# Patient Record
Sex: Female | Born: 1950 | Race: White | Hispanic: No | Marital: Married | Smoking: Never smoker
Health system: Southern US, Community
[De-identification: ages and names within clinical notes are randomized; demographics above are authoritative.]

## PROBLEM LIST (undated history)

## (undated) DIAGNOSIS — I10 Essential (primary) hypertension: Secondary | ICD-10-CM

## (undated) DIAGNOSIS — R251 Tremor, unspecified: Secondary | ICD-10-CM

## (undated) DIAGNOSIS — F99 Mental disorder, not otherwise specified: Secondary | ICD-10-CM

## (undated) DIAGNOSIS — F329 Major depressive disorder, single episode, unspecified: Secondary | ICD-10-CM

## (undated) DIAGNOSIS — E119 Type 2 diabetes mellitus without complications: Secondary | ICD-10-CM

## (undated) DIAGNOSIS — F32A Depression, unspecified: Secondary | ICD-10-CM

## (undated) HISTORY — PX: NASAL SINUS SURGERY: SHX719

## (undated) HISTORY — DX: Major depressive disorder, single episode, unspecified: F32.9

## (undated) HISTORY — DX: Depression, unspecified: F32.A

## (undated) HISTORY — DX: Type 2 diabetes mellitus without complications: E11.9

## (undated) HISTORY — DX: Essential (primary) hypertension: I10

## (undated) HISTORY — PX: GALLBLADDER SURGERY: SHX652

## (undated) HISTORY — PX: YAG LASER APPLICATION: SHX6189

## (undated) HISTORY — PX: FOOT SURGERY: SHX648

## (undated) HISTORY — DX: Tremor, unspecified: R25.1

## (undated) HISTORY — DX: Mental disorder, not otherwise specified: F99

## (undated) HISTORY — PX: CATARACT EXTRACTION: SUR2

## (undated) HISTORY — PX: OTHER SURGICAL HISTORY: SHX169

---

## 1997-09-06 ENCOUNTER — Other Ambulatory Visit: Admission: RE | Admit: 1997-09-06 | Discharge: 1997-09-06 | Payer: Self-pay | Admitting: Internal Medicine

## 1999-02-20 ENCOUNTER — Other Ambulatory Visit: Admission: RE | Admit: 1999-02-20 | Discharge: 1999-02-20 | Payer: Self-pay | Admitting: Internal Medicine

## 1999-10-05 ENCOUNTER — Ambulatory Visit (HOSPITAL_COMMUNITY): Admission: RE | Admit: 1999-10-05 | Discharge: 1999-10-05 | Payer: Self-pay | Admitting: Neurology

## 1999-10-05 ENCOUNTER — Encounter: Payer: Self-pay | Admitting: Neurology

## 2001-03-22 ENCOUNTER — Ambulatory Visit (HOSPITAL_COMMUNITY): Admission: RE | Admit: 2001-03-22 | Discharge: 2001-03-22 | Payer: Self-pay | Admitting: Internal Medicine

## 2001-03-22 ENCOUNTER — Encounter: Payer: Self-pay | Admitting: Internal Medicine

## 2001-10-04 ENCOUNTER — Ambulatory Visit (HOSPITAL_COMMUNITY): Admission: RE | Admit: 2001-10-04 | Discharge: 2001-10-04 | Payer: Self-pay | Admitting: Pulmonary Disease

## 2001-10-04 ENCOUNTER — Encounter: Payer: Self-pay | Admitting: Pulmonary Disease

## 2002-08-24 ENCOUNTER — Encounter: Payer: Self-pay | Admitting: Pulmonary Disease

## 2002-08-24 ENCOUNTER — Ambulatory Visit (HOSPITAL_COMMUNITY): Admission: RE | Admit: 2002-08-24 | Discharge: 2002-08-24 | Payer: Self-pay | Admitting: Pulmonary Disease

## 2003-02-15 ENCOUNTER — Encounter: Admission: RE | Admit: 2003-02-15 | Discharge: 2003-02-15 | Payer: Self-pay | Admitting: Otolaryngology

## 2003-02-15 ENCOUNTER — Encounter: Payer: Self-pay | Admitting: Otolaryngology

## 2003-02-21 ENCOUNTER — Encounter (INDEPENDENT_AMBULATORY_CARE_PROVIDER_SITE_OTHER): Payer: Self-pay | Admitting: *Deleted

## 2003-02-21 ENCOUNTER — Ambulatory Visit (HOSPITAL_COMMUNITY): Admission: RE | Admit: 2003-02-21 | Discharge: 2003-02-21 | Payer: Self-pay | Admitting: Otolaryngology

## 2003-08-07 ENCOUNTER — Encounter: Admission: RE | Admit: 2003-08-07 | Discharge: 2003-08-07 | Payer: Self-pay | Admitting: Neurology

## 2003-09-05 ENCOUNTER — Encounter: Admission: RE | Admit: 2003-09-05 | Discharge: 2003-09-05 | Payer: Self-pay | Admitting: Neurology

## 2003-10-01 ENCOUNTER — Encounter: Admission: RE | Admit: 2003-10-01 | Discharge: 2003-10-01 | Payer: Self-pay | Admitting: Otolaryngology

## 2006-01-14 ENCOUNTER — Inpatient Hospital Stay (HOSPITAL_COMMUNITY): Admission: EM | Admit: 2006-01-14 | Discharge: 2006-01-15 | Payer: Self-pay | Admitting: Emergency Medicine

## 2006-02-01 IMAGING — CT CT MAXILLOFACIAL W/ CM
3 of 4 series · 17 of 30 positions shown, 19 images · IV contrast (omnipaque)
Comparison: none

CLINICAL DATA: Follow-up sclerotic sphenoid lesion. The patient reports previous biopsy in January 2003.  
 CT MAXILLOFACIAL WITH CONTRAST
 Axial and direct coronal imaging of the paranasal sinuses was performed following the IV bolus injection of 75 cc Omnipaque 300.

[Series 4: coronal · axial · 0.33mm/px · z∈[-60,+27]mm · 6 of 51 slices shown, 8 images]
[im 8/51  brain]
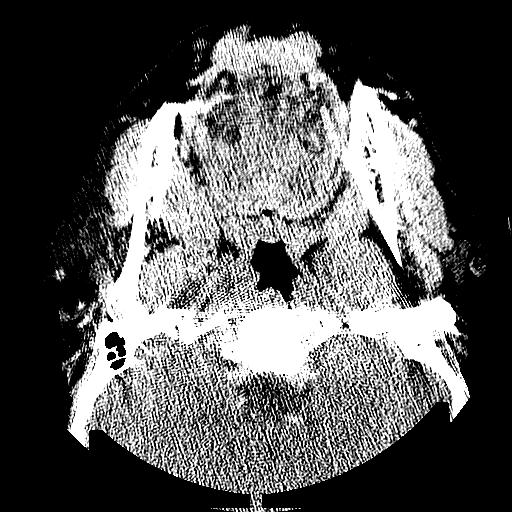
[im 8/51  bone]
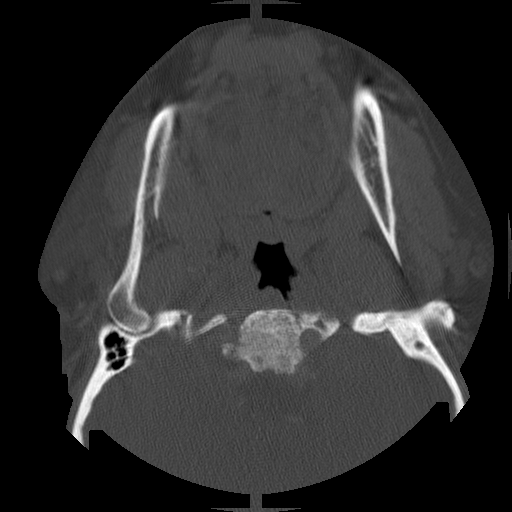
[im 15/51  bone]
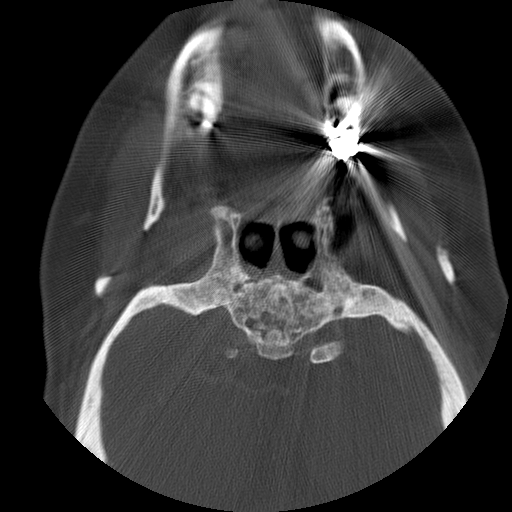
[im 22/51  bone]
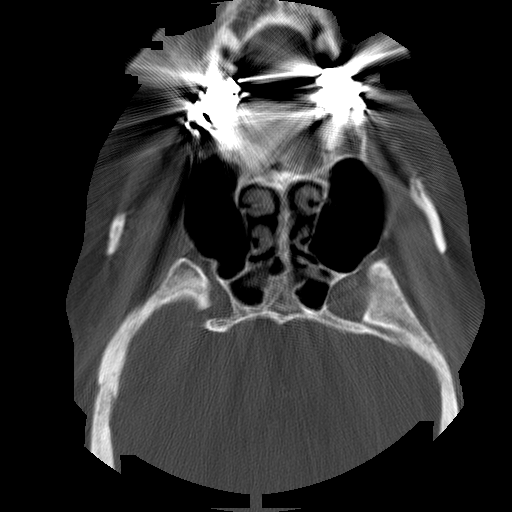
[im 29/51  bone]
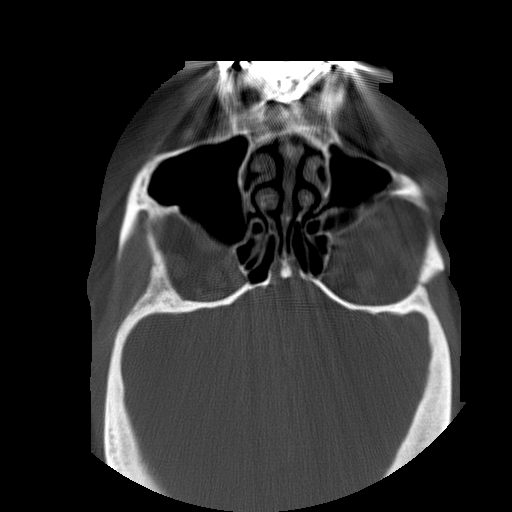
[im 36/51  brain]
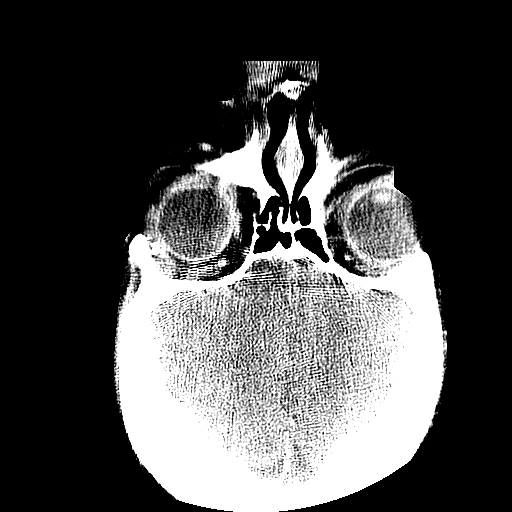
[im 36/51  bone]
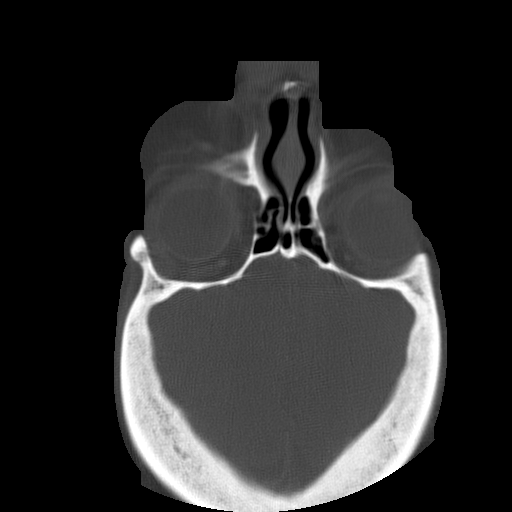
[im 43/51  bone]
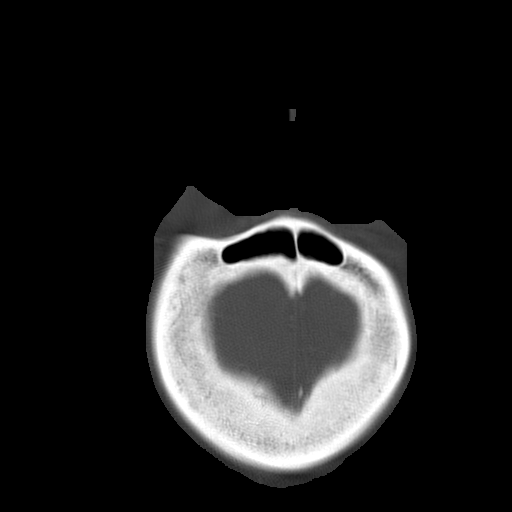

[Series 5: recon 2: coronal · axial · 0.33mm/px · z∈[-60,+27]mm · 6 of 51 slices shown]
[im 8/51  bone]
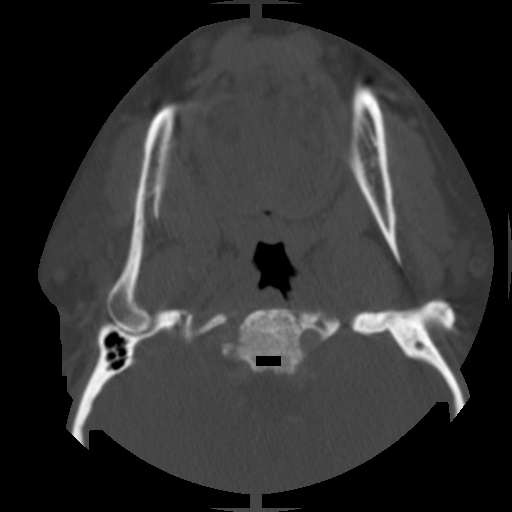
[im 15/51  bone]
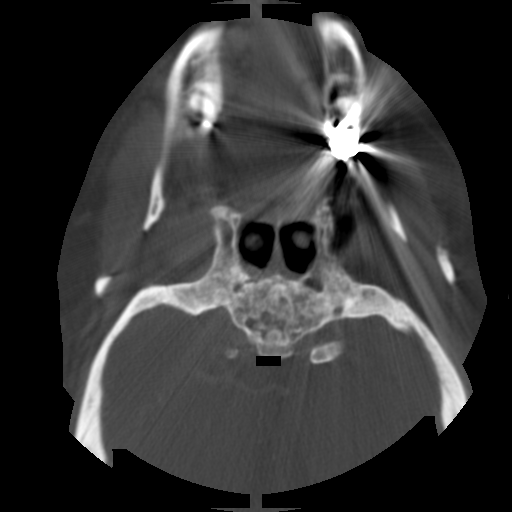
[im 22/51  bone]
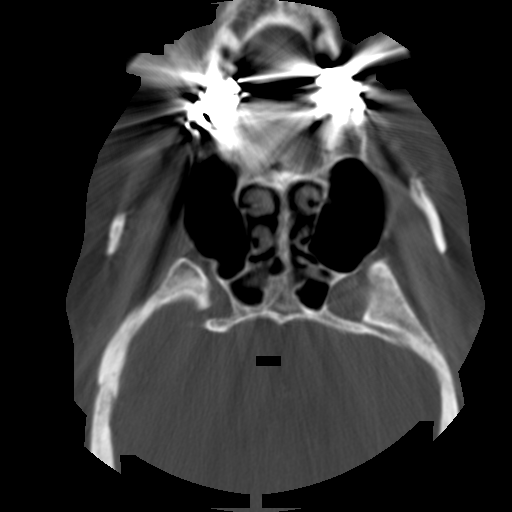
[im 29/51  bone]
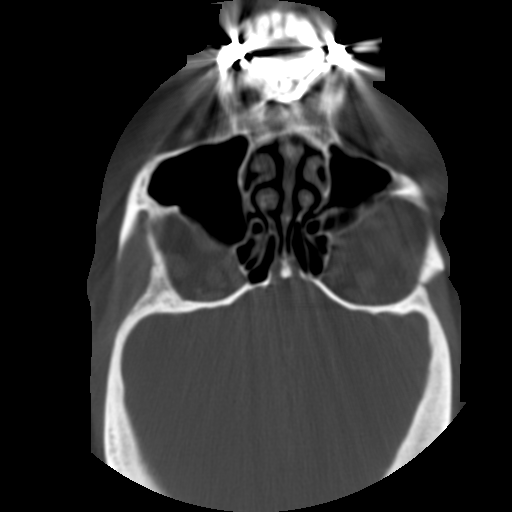
[im 36/51  bone]
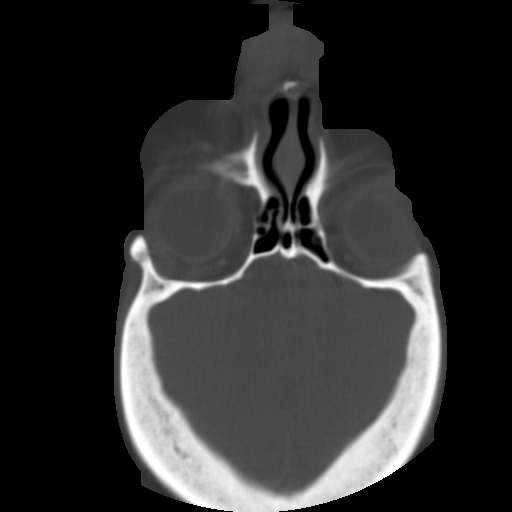
[im 43/51  bone]
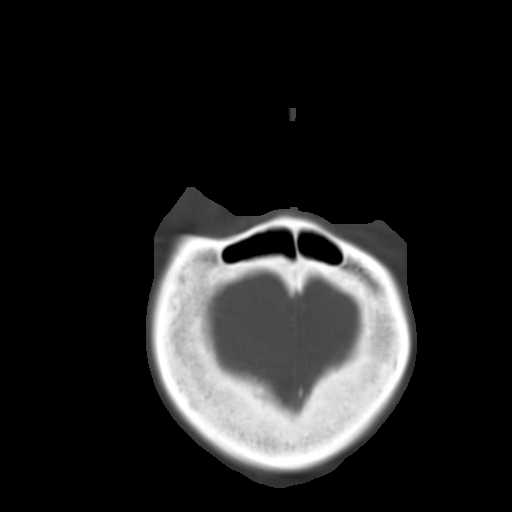

[Series 7: supine sinuses · axial · 0.33mm/px · z∈[-47,+31]mm · 5 of 47 slices shown]
[im 8/47  bone]
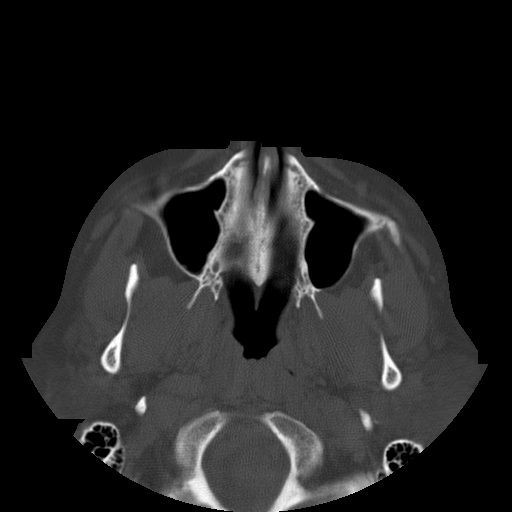
[im 16/47  bone]
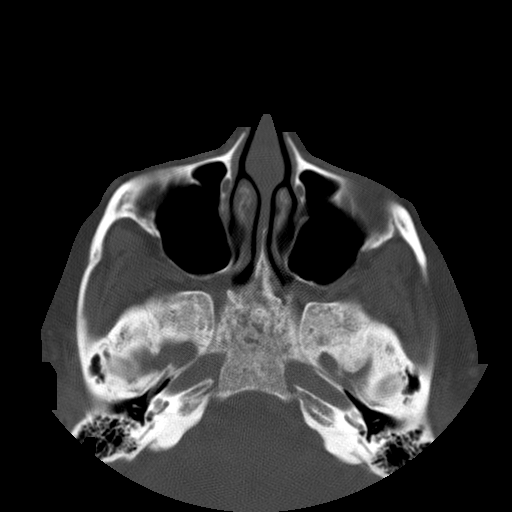
[im 24/47  bone]
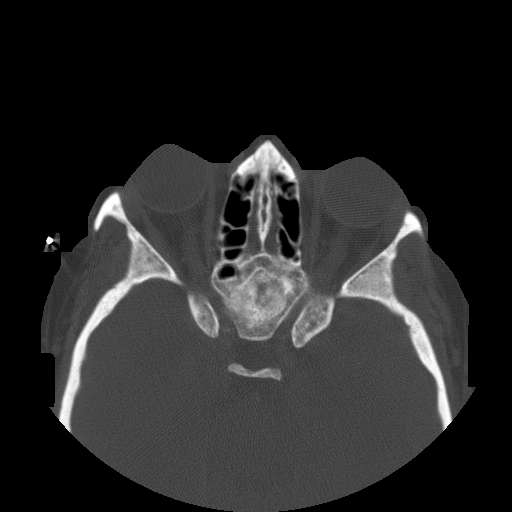
[im 31/47  bone]
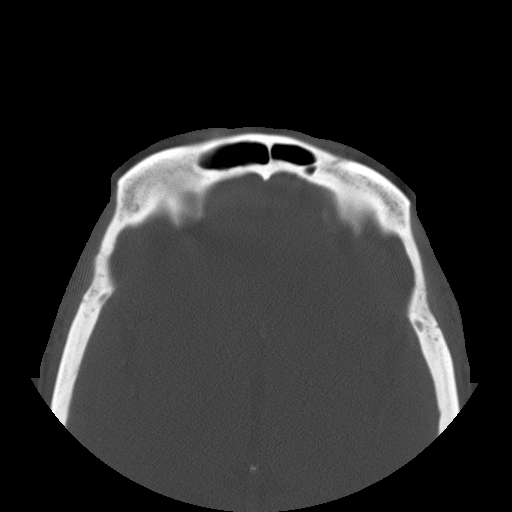
[im 39/47  bone]
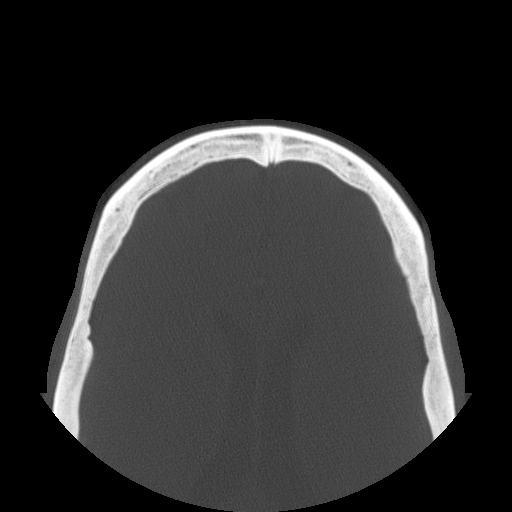

[17 of 30 positions shown; findings below may reference images not displayed]

Correlation is made with the prior study done 02/15/03 at [REDACTED]. 

 No change is demonstrated in the sclerotic slightly expansile lesion of the skull base, predominantly involving the clivus, anterior clinoid and bilateral pterygoid processes.  There is no associated soft tissue mass.  Slight narrowing of the right Vidian canal is again demonstrated, without apparent change.  No compromise of the orbital apex is demonstrated.  Additional skull base foramina appear unremarkable.

 The paranasal sinuses remain clear without air fluid levels or mucosal thickening.  Chronic deformity of the sphenoid sinus secondary to the bony expansion of the clivus appears stable.  The ostiomeatal complexes appear bilaterally.  No orbital pathology or intracranial abnormality is demonstrated.
 IMPRESSION
 1.  Stable sclerotic lesion of the skull base, most compatible with fibrous dysplasia.  There is stable mild narrowing of the right Vidian canal with respect to the left. 
 2.  No evidence of associated soft tissue mass or acute sinusitis.

## 2008-09-04 ENCOUNTER — Encounter: Admission: RE | Admit: 2008-09-04 | Discharge: 2008-09-04 | Payer: Self-pay | Admitting: Neurology

## 2008-09-19 ENCOUNTER — Encounter: Admission: RE | Admit: 2008-09-19 | Discharge: 2008-09-19 | Payer: Self-pay | Admitting: Neurology

## 2010-05-19 ENCOUNTER — Encounter: Payer: Self-pay | Admitting: Neurology

## 2010-09-12 NOTE — Cardiovascular Report (Signed)
[name], Lane NO.:  [id number]   MEDICAL RECORD NO.:  [id number]          PATIENT TYPE:  INP   LOCATION:  2807                         FACILITY:  [facility]   PHYSICIAN:  Darlin Priestly, MD  DATE OF BIRTH:  02-24-1951   DATE OF PROCEDURE:  DATE OF DISCHARGE:                              CARDIAC CATHETERIZATION   PROCEDURE:  1. Left heart catheterization.  2. Coronary angiography.  3. Left ventriculogram.   ATTENDING:  Dr. Lenise Herald.   COMPLICATIONS:  None.   INDICATIONS:  [name] is a 60 year old female with a history of diabetes  who was seen in the office today, on January 14, 2006, with a complaint of  2 weeks of substernal chest pain radiating to her back and left arm,  associated with shortness of breath and nausea.  She has had remote cardiac  catheterization done in [facility] which she believes was normal.  She is  now referred for re-catheterization to rule out significant CAD.   DESCRIPTION OF OPERATION:  After getting informed consent, the patient was  brought to the cardiac cath lab.  The right groin was shaved, prepped and  draped in the usual sterile fashion.  __________ was established.  Using  Modified Seldinger technique, a #6-French arterial sheath was inserted in  the right femoral artery.  A 6-French diagnostic catheter was then  used to  perform diagnostic angiography.   Left main is a large vessel with no significant disease.   The LAD is a large vessel which courses __________ two diagonal branches.  The LAD has mild 23rd % ostial narrowing.  The remainder of the LAD has no  significant disease.   First and second diagonal show small- to medium-sized vessels with no  significant disease.   Left circumflex is a large vessel which courses to the AV grove to two  obtuse marginal branches.  The AV groove circumflex has no significant  disease.   The first OM is a large vessel which bifurcates into the mid segment  with no  significant disease.   The second OM is a small vessel with no significant disease.   The right coronary artery is a large vessel that is dominant.  It gives rise  to a PDA as well as a posterolateral branch.  There is no significant  disease in the RCA, PDA or posterolateral branch.   Left ventriculogram reveals a preserved EF of 60%.   HEMODYNAMICS:  Systemic arterial pressure 130/73, LV systolic pressure  125/4, LVEDP of 9.   CONCLUSION:  1. No significant coronary artery disease.  2. Normal LV systolic function.      Darlin Priestly, MD  Electronically Signed     RHM/MEDQ  D:  01/14/2006  T:  01/16/2006  Job:  [id number]

## 2010-09-12 NOTE — Op Note (Signed)
Patricia Lane, Patricia Lane                            ACCOUNT NO.:  [id number]   MEDICAL RECORD NO.:  [id number]                   PATIENT TYPE:  OIB   LOCATION:  2891                                 FACILITY:  [facility]   PHYSICIAN:  Jefry H. Pollyann Kennedy, M.D.                DATE OF BIRTH:  1950/05/15   DATE OF PROCEDURE:  02/21/2003  DATE OF DISCHARGE:                                 OPERATIVE REPORT   PREOPERATIVE DIAGNOSIS:  Sphenoid/clivus mass.   POSTOPERATIVE DIAGNOSIS:  Sphenoid/clivus mass.   OPERATION PERFORMED:  Right endoscopic sphenoidotomy with biopsy of sphenoid  mass.   SURGEON:  Jefry H. Pollyann Kennedy, M.D.   ANESTHESIA:  General endotracheal.   COMPLICATIONS:  None.   ESTIMATED BLOOD LOSS:  60mL.   FINDINGS:  Severe thickening and hardening of the bone of the face of the  sphenoid with an irregularly surfaced bony contents within the sphenoid,  minimal airspace present.  Biopsies were sent for pathologic evaluation.   REFERRING PHYSICIAN:  1. C. Lesia Sago, M.D.  2. Coletta Memos, M.D.   INDICATIONS FOR PROCEDURE:  The patient is a 60 year old lady who recently  had a syncopal episode and was being worked up.  Had an MRI scan of the  brain which revealed a mass involving the sphenoid and clivus area.  CT  scans were performed as well.  This had the appearance consistent with  fibrous dysplasia although this was not a definitive diagnosis.  The risks,  benefits, alternatives and complications of the procedure were explained to  the patient, who  seemed to understand and agreed to surgery.   DESCRIPTION OF PROCEDURE:  The patient was taken to the operating room and  placed on the operating table in the supine position.  Following induction  of general endotracheal anesthesia, the patient was prepped and draped in  standard fashion.  Oxymetazoline spray was used preoperatively in the nasal  cavities.  1% Xylocaine with epinephrine was infiltrated into the face of  the  sphenoid, posterior septum bilaterally.  A spinal needle was used to  perform this.  A 0 degree nasal endoscope was used through the right nasal  cavity to view the posterior nasal cavity just adjacent to the superior  turbinate.  The posterior septum/sphenoid space was identified.  Some mucosa  was bitten away from this area using through-cut forceps.  There was no  identifiable ostium to the sinus.  A 4 mm osteotome was used to remove the  face of the sphenoid and posterior septum.  This came out in one nice piece.  The underlying bone had an irregular surface to it.  There was no mucosal  ulceration.  There was no evidence of infection or polypoid disease.  The  osteotome was used to shave off multiple fragments from the mass as well as  a small curet.  These were sent for pathologic evaluation.  The endoscopy  was performed on the left side as well and  an attempt was also made to open the sphenoid on the left in hopes that this  may be easier than the right side, but it was not.  The nasal cavities  posteriorly were packed with pledgets soaked in Afrin.  The patient was then  awakened, extubated and transferred to recovery in stable condition where  the pledgets were then removed.                                                Jefry H. Pollyann Kennedy, M.D.    JHR/MEDQ  D:  02/21/2003  T:  02/21/2003  Job:  [id number]   cc:   Marlan Palau, M.D.  [address]  [address]  [place]  [place] [address]  Fax: [phone]   Coletta Memos, M.D.  [address]  [place]  [place] [address]  Fax: [phone]

## 2010-09-12 NOTE — Discharge Summary (Signed)
NAMEBERETTA, GINSBERG NO.:  [id number]   MEDICAL RECORD NO.:  [id number]          PATIENT TYPE:  INP   LOCATION:  4729                         FACILITY:  [facility]   PHYSICIAN:  Lezlie Octave, N.P.     DATE OF BIRTH:  09/25/1950   DATE OF ADMISSION:  01/14/2006  DATE OF DISCHARGE:  01/15/2006                                 DISCHARGE SUMMARY   HISTORY OF PRESENT ILLNESS:  Ms. Seese is a 60 year old white female who was  seen in the office for the first time on January 14, 2006.  She was  complaining about a 2-week history of chest pain, left arm pain, shortness  of breath, and nausea.  She was seen by Dr. Nanetta Batty.  It was decided  to admit her.  Started her on IV nitroglycerin and heparin, and she should  undergo cardiac catheterization.  She apparently had multiple risk factors  including diabetes, hypertension, hyperlipidemia, and I believe, premature  family history of heart disease.  She underwent cardiac catheterization on  January 14, 2006.  It showed normal coronary arteries except for 30% LAD  proximal stenosis.  Her EF was 60%.  The morning of January 15, 2006, she  said she had had some slight chest discomfort intermittently but it was  mild.  She was placed on Protonix for PPI prophylaxis, in case her chest  pain was related to GI disturbance.  She then had a run of Y-complex  tachycardia, 13 beats which was irregular.  It was decided it was atrial fib  with a variance, was reviewed by Dr. Jacinto Halim.  He talked to the patient about  atrial fib and Coumadin therapy.  It is decided that since she was  asymptomatic, she should wear a 3 week cardia net monitor to evaluate the  frequency of her A-fib.  If frequent, she will need Coumadin therapy and  consideration for A-fib ablation.  He also explained to her that she should  lose weight and the importance of her weight loss in preventing atrial fib,  etc.   LABORATORY DATA:  Hemoglobin 11.9, hematocrit  33.7, platelets 280, WBC 5.5,  sodium 140, potassium 3.7, BUN 5, creatinine 0.9, glucose was 159.   DISCHARGE MEDICATIONS:  1. Glucophage 1000 mg 2 times a day.  She should start that on Sunday.  2. Avalide 150/12.5 every day.  3. Neurontin 400 mg 2 times per day.  4. Actos 30 mg 1 time per day.  5. Lantus insulin 30 units at bedtime.  6. Aspirin 325 mg every day.  7. Protonix 40 mg every day.  8. Toprol-XL 25 mg daily.  9. Calcium as taken previously.   FOLLOWUP:  She will followup with myself for a groin check on January 19, 2006, at 10:00 a.m.  The cardia net company will call her about instructions  on placing her monitor.  And she will followup with Dr. Nanetta Batty on  February 12, 2006, at 9:00 a.m.   DISCHARGE DIAGNOSIS:  1. Chest pain, not coronary ischemia, status post cardiac catheterization  with only 30% LAD proximal.  Chest pain probably related to      gastroesophageal reflux disease versus musculoskeletal origin.  2. Insulin dependent diabetes mellitus, uncontrolled.  3. Hypertension.  4. Questionable gastroesophageal reflux disease.  5. Questionable musculoskeletal chest pain.  6. Paroxysmal atrial fibrillation with rapid ventricular response.  She      had 13 beats with Y-complex tachycardia.  It was very irregular.      Reviewed by Dr. Jacinto Halim.  Considered to be atrial fibrillation with a      variance.      Lezlie Octave, N.P.     BB/MEDQ  D:  01/15/2006  T:  01/16/2006  Job:  [id number]   cc:   MD Ranae Pila

## 2011-12-14 DIAGNOSIS — L909 Atrophic disorder of skin, unspecified: Secondary | ICD-10-CM | POA: Diagnosis not present

## 2011-12-14 DIAGNOSIS — L919 Hypertrophic disorder of the skin, unspecified: Secondary | ICD-10-CM | POA: Diagnosis not present

## 2011-12-14 DIAGNOSIS — R945 Abnormal results of liver function studies: Secondary | ICD-10-CM | POA: Diagnosis not present

## 2011-12-14 DIAGNOSIS — I10 Essential (primary) hypertension: Secondary | ICD-10-CM | POA: Diagnosis not present

## 2011-12-14 DIAGNOSIS — E1149 Type 2 diabetes mellitus with other diabetic neurological complication: Secondary | ICD-10-CM | POA: Diagnosis not present

## 2011-12-14 DIAGNOSIS — Z79899 Other long term (current) drug therapy: Secondary | ICD-10-CM | POA: Diagnosis not present

## 2011-12-14 DIAGNOSIS — E7800 Pure hypercholesterolemia, unspecified: Secondary | ICD-10-CM | POA: Diagnosis not present

## 2011-12-17 DIAGNOSIS — Z79899 Other long term (current) drug therapy: Secondary | ICD-10-CM | POA: Diagnosis not present

## 2011-12-17 DIAGNOSIS — E7800 Pure hypercholesterolemia, unspecified: Secondary | ICD-10-CM | POA: Diagnosis not present

## 2011-12-17 DIAGNOSIS — R945 Abnormal results of liver function studies: Secondary | ICD-10-CM | POA: Diagnosis not present

## 2011-12-17 DIAGNOSIS — E1149 Type 2 diabetes mellitus with other diabetic neurological complication: Secondary | ICD-10-CM | POA: Diagnosis not present

## 2011-12-23 DIAGNOSIS — L909 Atrophic disorder of skin, unspecified: Secondary | ICD-10-CM | POA: Diagnosis not present

## 2012-01-15 DIAGNOSIS — H11159 Pinguecula, unspecified eye: Secondary | ICD-10-CM | POA: Diagnosis not present

## 2012-01-15 DIAGNOSIS — E119 Type 2 diabetes mellitus without complications: Secondary | ICD-10-CM | POA: Diagnosis not present

## 2012-02-17 DIAGNOSIS — N952 Postmenopausal atrophic vaginitis: Secondary | ICD-10-CM | POA: Diagnosis not present

## 2012-02-17 DIAGNOSIS — Z1289 Encounter for screening for malignant neoplasm of other sites: Secondary | ICD-10-CM | POA: Diagnosis not present

## 2012-02-17 DIAGNOSIS — I10 Essential (primary) hypertension: Secondary | ICD-10-CM | POA: Diagnosis not present

## 2012-02-17 DIAGNOSIS — E119 Type 2 diabetes mellitus without complications: Secondary | ICD-10-CM | POA: Diagnosis not present

## 2012-02-17 DIAGNOSIS — Z23 Encounter for immunization: Secondary | ICD-10-CM | POA: Diagnosis not present

## 2012-02-17 DIAGNOSIS — Z79899 Other long term (current) drug therapy: Secondary | ICD-10-CM | POA: Diagnosis not present

## 2012-02-17 DIAGNOSIS — Z0000 Encounter for general adult medical examination without abnormal findings: Secondary | ICD-10-CM | POA: Diagnosis not present

## 2012-02-17 DIAGNOSIS — N959 Unspecified menopausal and perimenopausal disorder: Secondary | ICD-10-CM | POA: Diagnosis not present

## 2012-02-17 DIAGNOSIS — E1149 Type 2 diabetes mellitus with other diabetic neurological complication: Secondary | ICD-10-CM | POA: Diagnosis not present

## 2012-02-26 DIAGNOSIS — Z1231 Encounter for screening mammogram for malignant neoplasm of breast: Secondary | ICD-10-CM | POA: Diagnosis not present

## 2012-02-26 DIAGNOSIS — Z79899 Other long term (current) drug therapy: Secondary | ICD-10-CM | POA: Diagnosis not present

## 2012-02-26 DIAGNOSIS — N959 Unspecified menopausal and perimenopausal disorder: Secondary | ICD-10-CM | POA: Diagnosis not present

## 2012-04-16 DIAGNOSIS — K591 Functional diarrhea: Secondary | ICD-10-CM | POA: Diagnosis not present

## 2012-06-07 DIAGNOSIS — I10 Essential (primary) hypertension: Secondary | ICD-10-CM | POA: Diagnosis not present

## 2012-06-07 DIAGNOSIS — R945 Abnormal results of liver function studies: Secondary | ICD-10-CM | POA: Diagnosis not present

## 2012-06-07 DIAGNOSIS — E1149 Type 2 diabetes mellitus with other diabetic neurological complication: Secondary | ICD-10-CM | POA: Diagnosis not present

## 2012-06-07 DIAGNOSIS — E7800 Pure hypercholesterolemia, unspecified: Secondary | ICD-10-CM | POA: Diagnosis not present

## 2012-07-11 DIAGNOSIS — J069 Acute upper respiratory infection, unspecified: Secondary | ICD-10-CM | POA: Diagnosis not present

## 2012-07-19 DIAGNOSIS — J069 Acute upper respiratory infection, unspecified: Secondary | ICD-10-CM | POA: Diagnosis not present

## 2012-09-05 DIAGNOSIS — E1149 Type 2 diabetes mellitus with other diabetic neurological complication: Secondary | ICD-10-CM | POA: Diagnosis not present

## 2012-09-05 DIAGNOSIS — Z79899 Other long term (current) drug therapy: Secondary | ICD-10-CM | POA: Diagnosis not present

## 2012-09-05 DIAGNOSIS — E7800 Pure hypercholesterolemia, unspecified: Secondary | ICD-10-CM | POA: Diagnosis not present

## 2012-09-05 DIAGNOSIS — I10 Essential (primary) hypertension: Secondary | ICD-10-CM | POA: Diagnosis not present

## 2012-09-06 DIAGNOSIS — I10 Essential (primary) hypertension: Secondary | ICD-10-CM | POA: Diagnosis not present

## 2012-09-06 DIAGNOSIS — Z79899 Other long term (current) drug therapy: Secondary | ICD-10-CM | POA: Diagnosis not present

## 2012-09-06 DIAGNOSIS — E1149 Type 2 diabetes mellitus with other diabetic neurological complication: Secondary | ICD-10-CM | POA: Diagnosis not present

## 2012-09-06 DIAGNOSIS — E7800 Pure hypercholesterolemia, unspecified: Secondary | ICD-10-CM | POA: Diagnosis not present

## 2012-12-07 DIAGNOSIS — E7800 Pure hypercholesterolemia, unspecified: Secondary | ICD-10-CM | POA: Diagnosis not present

## 2012-12-07 DIAGNOSIS — I10 Essential (primary) hypertension: Secondary | ICD-10-CM | POA: Diagnosis not present

## 2012-12-07 DIAGNOSIS — E1149 Type 2 diabetes mellitus with other diabetic neurological complication: Secondary | ICD-10-CM | POA: Diagnosis not present

## 2012-12-07 DIAGNOSIS — M25519 Pain in unspecified shoulder: Secondary | ICD-10-CM | POA: Diagnosis not present

## 2013-01-24 DIAGNOSIS — H00029 Hordeolum internum unspecified eye, unspecified eyelid: Secondary | ICD-10-CM | POA: Diagnosis not present

## 2013-02-09 ENCOUNTER — Ambulatory Visit (INDEPENDENT_AMBULATORY_CARE_PROVIDER_SITE_OTHER): Payer: Medicare Other

## 2013-02-09 ENCOUNTER — Encounter (INDEPENDENT_AMBULATORY_CARE_PROVIDER_SITE_OTHER): Payer: Self-pay

## 2013-02-09 VITALS — BP 105/60 | HR 92 | Resp 16

## 2013-02-09 DIAGNOSIS — E1149 Type 2 diabetes mellitus with other diabetic neurological complication: Secondary | ICD-10-CM

## 2013-02-09 DIAGNOSIS — M1990 Unspecified osteoarthritis, unspecified site: Secondary | ICD-10-CM

## 2013-02-09 DIAGNOSIS — Q828 Other specified congenital malformations of skin: Secondary | ICD-10-CM | POA: Diagnosis not present

## 2013-02-09 DIAGNOSIS — E1140 Type 2 diabetes mellitus with diabetic neuropathy, unspecified: Secondary | ICD-10-CM

## 2013-02-09 DIAGNOSIS — M2040 Other hammer toe(s) (acquired), unspecified foot: Secondary | ICD-10-CM | POA: Diagnosis not present

## 2013-02-09 NOTE — Patient Instructions (Signed)

## 2013-02-09 NOTE — Progress Notes (Signed)
  Subjective:    Patient ID: Patricia Lane, female    DOB: 21-Aug-1950, 62 y.o.   MRN: [id number]  HPI Fourth toe is sore and tender and has been going on for the last couple of months and shoes bother and i do have neuropathy patient is significant contractures of lesser toes 234 and 5 bilateral. Has had history of previous surgery by Dr. Leeanne Deed in many years ago with hammertoe repairs. 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 Patient presented on arthroplasty however there is subluxation dislocation at the proximal IP joint fourth right  Review of Systems  Constitutional: Negative.   HENT: Negative.   Eyes: Negative.   Respiratory: Negative.   Cardiovascular:       Calf pain when walking  Gastrointestinal: Negative.   Endocrine: Negative.   Genitourinary: Negative.   Musculoskeletal: Positive for back pain.       Difficulty walking  Skin: Negative.   Allergic/Immunologic: Negative.   Neurological: Negative.   Hematological: Negative.        Swollen lymph node  Psychiatric/Behavioral: Negative.        Objective:   Physical Exam Vascular status appears to be intact with pedal pulses palpable DP +2/4. PT +1/4. Capillary refill timed 3-4 seconds all digits. Temperature warm. Minimal varicosities noted. Epicritic and proprioceptive sensations diminished on Semmes Weinstein testing to the forefoot and digits bilateral. DTRs not elicited. Hair growth absent bilateral nails mildly criptotic and discolored. 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 Keratoses secondary to unrelenting fifth digit. Keratotic lesion debridement at this time and tubercle  padding dispensed for patient use. Recommend using socks and shoes at all times. Patient is currently ambulating in flip-flops without socks. Dispensed recommendations for diabetic foot care, and recommendations for possible use future use a diabetic extra-depth shoe. Followup in 3 months for reevaluation. Contact the visiting changes or exacerbations in the interim.  Alvan Dame DPM

## 2013-02-24 DIAGNOSIS — E119 Type 2 diabetes mellitus without complications: Secondary | ICD-10-CM | POA: Diagnosis not present

## 2013-03-09 DIAGNOSIS — Z1239 Encounter for other screening for malignant neoplasm of breast: Secondary | ICD-10-CM | POA: Diagnosis not present

## 2013-03-09 DIAGNOSIS — Z79899 Other long term (current) drug therapy: Secondary | ICD-10-CM | POA: Diagnosis not present

## 2013-03-09 DIAGNOSIS — Z1289 Encounter for screening for malignant neoplasm of other sites: Secondary | ICD-10-CM | POA: Diagnosis not present

## 2013-03-09 DIAGNOSIS — E7800 Pure hypercholesterolemia, unspecified: Secondary | ICD-10-CM | POA: Diagnosis not present

## 2013-03-09 DIAGNOSIS — E1149 Type 2 diabetes mellitus with other diabetic neurological complication: Secondary | ICD-10-CM | POA: Diagnosis not present

## 2013-03-09 DIAGNOSIS — I10 Essential (primary) hypertension: Secondary | ICD-10-CM | POA: Diagnosis not present

## 2013-03-17 DIAGNOSIS — Z1231 Encounter for screening mammogram for malignant neoplasm of breast: Secondary | ICD-10-CM | POA: Diagnosis not present

## 2013-05-18 ENCOUNTER — Ambulatory Visit (INDEPENDENT_AMBULATORY_CARE_PROVIDER_SITE_OTHER): Payer: Medicare Other

## 2013-05-18 VITALS — BP 115/74 | HR 95 | Resp 18

## 2013-05-18 DIAGNOSIS — E1140 Type 2 diabetes mellitus with diabetic neuropathy, unspecified: Secondary | ICD-10-CM

## 2013-05-18 DIAGNOSIS — Q828 Other specified congenital malformations of skin: Secondary | ICD-10-CM

## 2013-05-18 DIAGNOSIS — E1142 Type 2 diabetes mellitus with diabetic polyneuropathy: Secondary | ICD-10-CM

## 2013-05-18 DIAGNOSIS — M1990 Unspecified osteoarthritis, unspecified site: Secondary | ICD-10-CM

## 2013-05-18 DIAGNOSIS — E1149 Type 2 diabetes mellitus with other diabetic neurological complication: Secondary | ICD-10-CM

## 2013-05-18 NOTE — Patient Instructions (Signed)
Diabetes and Foot Care Diabetes may cause you to have problems because of poor blood supply (circulation) to your feet and legs. This may cause the skin on your feet to become thinner, break easier, and heal more slowly. Your skin may become dry, and the skin may peel and crack. You may also have nerve damage in your legs and feet causing decreased feeling in them. You may not notice minor injuries to your feet that could lead to infections or more serious problems. Taking care of your feet is one of the most important things you can do for yourself.  HOME CARE INSTRUCTIONS  Wear shoes at all times, even in the house. Do not go barefoot. Bare feet are easily injured.  Check your feet daily for blisters, cuts, and redness. If you cannot see the bottom of your feet, use a mirror or ask someone for help.  Wash your feet with warm water (do not use hot water) and mild soap. Then pat your feet and the areas between your toes until they are completely dry. Do not soak your feet as this can dry your skin.  Apply a moisturizing lotion or petroleum jelly (that does not contain alcohol and is unscented) to the skin on your feet and to dry, brittle toenails. Do not apply lotion between your toes.  Trim your toenails straight across. Do not dig under them or around the cuticle. File the edges of your nails with an emery board or nail file.  Do not cut corns or calluses or try to remove them with medicine.  Wear clean socks or stockings every day. Make sure they are not too tight. Do not wear knee-high stockings since they may decrease blood flow to your legs.  Wear shoes that fit properly and have enough cushioning. To break in new shoes, wear them for just a few hours a day. This prevents you from injuring your feet. Always look in your shoes before you put them on to be sure there are no objects inside.  Do not cross your legs. This may decrease the blood flow to your feet.  If you find a minor scrape,  cut, or break in the skin on your feet, keep it and the skin around it clean and dry. These areas may be cleansed with mild soap and water. Do not cleanse the area with peroxide, alcohol, or iodine.  When you remove an adhesive bandage, be sure not to damage the skin around it.  If you have a wound, look at it several times a day to make sure it is healing.  Do not use heating pads or hot water bottles. They may burn your skin. If you have lost feeling in your feet or legs, you may not know it is happening until it is too late.  Make sure your health care provider performs a complete foot exam at least annually or more often if you have foot problems. Report any cuts, sores, or bruises to your health care provider immediately. SEEK MEDICAL CARE IF:   You have an injury that is not healing.  You have cuts or breaks in the skin.  You have an ingrown nail.  You notice redness on your legs or feet.  You feel burning or tingling in your legs or feet.  You have pain or cramps in your legs and feet.  Your legs or feet are numb.  Your feet always feel cold. SEEK IMMEDIATE MEDICAL CARE IF:   There is increasing redness,   swelling, or pain in or around a wound.  There is a red line that goes up your leg.  Pus is coming from a wound.  You develop a fever or as directed by your health care provider.  You notice a bad smell coming from an ulcer or wound. Document Released: 04/10/2000 Document Revised: 12/14/2012 Document Reviewed: 09/20/2012 [employer] Patient Information 2014 [employer].  

## 2013-05-18 NOTE — Progress Notes (Signed)
   Subjective:    Patient ID: Patricia Lane, female    DOB: 1950/11/24, 63 y.o.   MRN: [id number]  HPI I am here to get my diabetic shoes    Review of Systems no new changes or findings at this time     Objective:   Physical Exam Vascular status is intact as follows pedal pulses palpable DP postal for PT plus one over 4 bilateral Refill time 4 seconds no other new changes or findings does have mild diffuse keratoses secondary digital contractures. At this time dispensed 1 pair shoes and 3 pairs of dual density Plastizote inlays which are molded they're comfortable the patient's foot with full contact the foot. The shoes in lace fit and contour well to the thickness of the inlays the shoes or slight slightly snug to start however should use up over the next week or 2 once daily compress and flatten out patient should have been very good active fit there is no excessive tightness noted. Adequate space was identified.      Assessment & Plan:  Assessment diabetes with complications history of neuropathy multiple keratoses as well as osteoarthropathy. Plan at this time dispensed diabetic shoes and 3 pairs of custom molded dual density Plastizote inlays with oral and written instructions for use in break in period recheck in the future and as-needed basis for shoe adjustments if needed or for palliative care and as-needed basis  Harriet Masson DPM

## 2013-06-08 DIAGNOSIS — I10 Essential (primary) hypertension: Secondary | ICD-10-CM | POA: Diagnosis not present

## 2013-06-08 DIAGNOSIS — E7800 Pure hypercholesterolemia, unspecified: Secondary | ICD-10-CM | POA: Diagnosis not present

## 2013-06-08 DIAGNOSIS — Z79899 Other long term (current) drug therapy: Secondary | ICD-10-CM | POA: Diagnosis not present

## 2013-06-08 DIAGNOSIS — E1149 Type 2 diabetes mellitus with other diabetic neurological complication: Secondary | ICD-10-CM | POA: Diagnosis not present

## 2013-06-20 DIAGNOSIS — I10 Essential (primary) hypertension: Secondary | ICD-10-CM | POA: Diagnosis not present

## 2013-06-20 DIAGNOSIS — Z79899 Other long term (current) drug therapy: Secondary | ICD-10-CM | POA: Diagnosis not present

## 2013-06-20 DIAGNOSIS — E7800 Pure hypercholesterolemia, unspecified: Secondary | ICD-10-CM | POA: Diagnosis not present

## 2013-06-20 DIAGNOSIS — E1149 Type 2 diabetes mellitus with other diabetic neurological complication: Secondary | ICD-10-CM | POA: Diagnosis not present

## 2013-09-20 DIAGNOSIS — E7800 Pure hypercholesterolemia, unspecified: Secondary | ICD-10-CM | POA: Diagnosis not present

## 2013-09-20 DIAGNOSIS — I10 Essential (primary) hypertension: Secondary | ICD-10-CM | POA: Diagnosis not present

## 2013-09-20 DIAGNOSIS — E1149 Type 2 diabetes mellitus with other diabetic neurological complication: Secondary | ICD-10-CM | POA: Diagnosis not present

## 2013-10-10 DIAGNOSIS — M545 Low back pain, unspecified: Secondary | ICD-10-CM | POA: Diagnosis not present

## 2013-10-11 DIAGNOSIS — M4120 Other idiopathic scoliosis, site unspecified: Secondary | ICD-10-CM | POA: Diagnosis not present

## 2013-10-11 DIAGNOSIS — M47817 Spondylosis without myelopathy or radiculopathy, lumbosacral region: Secondary | ICD-10-CM | POA: Diagnosis not present

## 2013-10-16 DIAGNOSIS — M549 Dorsalgia, unspecified: Secondary | ICD-10-CM | POA: Diagnosis not present

## 2013-10-16 DIAGNOSIS — G8929 Other chronic pain: Secondary | ICD-10-CM | POA: Diagnosis not present

## 2013-10-17 ENCOUNTER — Encounter: Payer: Self-pay | Admitting: Gastroenterology

## 2013-12-20 DIAGNOSIS — E7800 Pure hypercholesterolemia, unspecified: Secondary | ICD-10-CM | POA: Diagnosis not present

## 2013-12-20 DIAGNOSIS — E1149 Type 2 diabetes mellitus with other diabetic neurological complication: Secondary | ICD-10-CM | POA: Diagnosis not present

## 2013-12-20 DIAGNOSIS — M545 Low back pain, unspecified: Secondary | ICD-10-CM | POA: Diagnosis not present

## 2013-12-20 DIAGNOSIS — Z79899 Other long term (current) drug therapy: Secondary | ICD-10-CM | POA: Diagnosis not present

## 2013-12-20 DIAGNOSIS — I10 Essential (primary) hypertension: Secondary | ICD-10-CM | POA: Diagnosis not present

## 2013-12-30 DIAGNOSIS — K089 Disorder of teeth and supporting structures, unspecified: Secondary | ICD-10-CM | POA: Diagnosis not present

## 2014-01-22 DIAGNOSIS — E7800 Pure hypercholesterolemia, unspecified: Secondary | ICD-10-CM | POA: Diagnosis not present

## 2014-01-22 DIAGNOSIS — E1149 Type 2 diabetes mellitus with other diabetic neurological complication: Secondary | ICD-10-CM | POA: Diagnosis not present

## 2014-01-22 DIAGNOSIS — I10 Essential (primary) hypertension: Secondary | ICD-10-CM | POA: Diagnosis not present

## 2014-03-15 DIAGNOSIS — E119 Type 2 diabetes mellitus without complications: Secondary | ICD-10-CM | POA: Diagnosis not present

## 2014-04-02 DIAGNOSIS — Z79891 Long term (current) use of opiate analgesic: Secondary | ICD-10-CM | POA: Diagnosis not present

## 2014-04-02 DIAGNOSIS — I10 Essential (primary) hypertension: Secondary | ICD-10-CM | POA: Diagnosis not present

## 2014-04-02 DIAGNOSIS — E780 Pure hypercholesterolemia: Secondary | ICD-10-CM | POA: Diagnosis not present

## 2014-04-02 DIAGNOSIS — Z0000 Encounter for general adult medical examination without abnormal findings: Secondary | ICD-10-CM | POA: Diagnosis not present

## 2014-04-02 DIAGNOSIS — E1140 Type 2 diabetes mellitus with diabetic neuropathy, unspecified: Secondary | ICD-10-CM | POA: Diagnosis not present

## 2014-05-03 ENCOUNTER — Ambulatory Visit (INDEPENDENT_AMBULATORY_CARE_PROVIDER_SITE_OTHER): Payer: Medicare Other

## 2014-05-03 VITALS — BP 110/69 | HR 72 | Resp 12

## 2014-05-03 DIAGNOSIS — M159 Polyosteoarthritis, unspecified: Secondary | ICD-10-CM

## 2014-05-03 DIAGNOSIS — M150 Primary generalized (osteo)arthritis: Secondary | ICD-10-CM | POA: Diagnosis not present

## 2014-05-03 DIAGNOSIS — Q828 Other specified congenital malformations of skin: Secondary | ICD-10-CM

## 2014-05-03 DIAGNOSIS — E1141 Type 2 diabetes mellitus with diabetic mononeuropathy: Secondary | ICD-10-CM

## 2014-05-03 DIAGNOSIS — M2040 Other hammer toe(s) (acquired), unspecified foot: Secondary | ICD-10-CM | POA: Diagnosis not present

## 2014-05-03 DIAGNOSIS — E1140 Type 2 diabetes mellitus with diabetic neuropathy, unspecified: Secondary | ICD-10-CM | POA: Diagnosis not present

## 2014-05-03 NOTE — Progress Notes (Signed)
   Subjective:    Patient ID: Patricia Lane, female    DOB: 1950/08/30, 64 y.o.   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 Decreased sensation to the forefoot and digits. Patient does have distal clavus fourth left which is debrided at this time there is no open wounds no ulcers no secondary infection does have diabetic shoes which have started lose her effectiveness the orthoses likely need replacement have not received authorization for shoes will continue authorization for diabetic extra-depth shoes at this time. He contacted when authorization received and patient ready for measurement and fitting.       Assessment & Plan:  Assessment diabetes with history of complications digital contractures hammertoe deformity with keratoses fourth left debrided at this time maintain accommodative shoes we'll obtain authorization for new diabetic shoes at this time. Follow-up in 3 months for palliative care if needed he called when shoe measurement fitting needs to be obtained. Patient will continue with gabapentin other medications at this point appears to be stable intact motor functions has adjusted well to her shoes will follow-up as scheduled in 3 months  Harriet Masson DPM

## 2014-05-10 DIAGNOSIS — Z1231 Encounter for screening mammogram for malignant neoplasm of breast: Secondary | ICD-10-CM | POA: Diagnosis not present

## 2014-06-15 ENCOUNTER — Ambulatory Visit: Payer: Medicare Other

## 2014-06-15 ENCOUNTER — Ambulatory Visit (INDEPENDENT_AMBULATORY_CARE_PROVIDER_SITE_OTHER): Payer: Medicare Other

## 2014-06-15 ENCOUNTER — Other Ambulatory Visit: Payer: Medicare Other

## 2014-06-15 DIAGNOSIS — Q828 Other specified congenital malformations of skin: Secondary | ICD-10-CM

## 2014-06-15 DIAGNOSIS — E1140 Type 2 diabetes mellitus with diabetic neuropathy, unspecified: Secondary | ICD-10-CM

## 2014-06-15 DIAGNOSIS — M159 Polyosteoarthritis, unspecified: Secondary | ICD-10-CM

## 2014-06-15 DIAGNOSIS — E1141 Type 2 diabetes mellitus with diabetic mononeuropathy: Secondary | ICD-10-CM

## 2014-06-15 DIAGNOSIS — M2040 Other hammer toe(s) (acquired), unspecified foot: Secondary | ICD-10-CM

## 2014-06-15 DIAGNOSIS — M150 Primary generalized (osteo)arthritis: Secondary | ICD-10-CM

## 2014-06-15 NOTE — Progress Notes (Signed)
   Subjective:    Patient ID: Patricia Lane, female    DOB: December 20, 1950, 64 y.o.   MRN: [id number]  HPI I AM HERE TO GET MEASURED FOR MY SHOES    Review of Systems no new findings or systemic changes noted    Objective:   Physical Exam Patient does have diabetes with history peripheral neuropathy and angiopathy arthropathy with gait abnormalities and weakness patient is digital contractures with multiple keratoses and pre-also keratoses noted. Is a candidate for replacement of diabetic shoes and custom insoles at this time.       Assessment & Plan:  Assessment diabetes with history peripheral neuropathy and angiopathy. Deformed criptotic incurvated nails also has multiple keratoses due to digital contractures osteoarthropathy. Measurements and bilateral full impressions for extra-depth shoes and molded inlays are carried out at this time patient be followed within the next month once shoes inlays are ready for fitting and dispensing  Harriet Masson DPM

## 2014-07-04 DIAGNOSIS — Z79891 Long term (current) use of opiate analgesic: Secondary | ICD-10-CM | POA: Diagnosis not present

## 2014-07-04 DIAGNOSIS — I10 Essential (primary) hypertension: Secondary | ICD-10-CM | POA: Diagnosis not present

## 2014-07-04 DIAGNOSIS — E780 Pure hypercholesterolemia: Secondary | ICD-10-CM | POA: Diagnosis not present

## 2014-07-04 DIAGNOSIS — E1140 Type 2 diabetes mellitus with diabetic neuropathy, unspecified: Secondary | ICD-10-CM | POA: Diagnosis not present

## 2014-07-19 ENCOUNTER — Ambulatory Visit (INDEPENDENT_AMBULATORY_CARE_PROVIDER_SITE_OTHER): Payer: Medicare Other

## 2014-07-19 VITALS — BP 146/77 | HR 85 | Resp 18

## 2014-07-19 DIAGNOSIS — E1140 Type 2 diabetes mellitus with diabetic neuropathy, unspecified: Secondary | ICD-10-CM | POA: Diagnosis not present

## 2014-07-19 DIAGNOSIS — Q828 Other specified congenital malformations of skin: Secondary | ICD-10-CM

## 2014-07-19 DIAGNOSIS — M2040 Other hammer toe(s) (acquired), unspecified foot: Secondary | ICD-10-CM

## 2014-07-19 DIAGNOSIS — M150 Primary generalized (osteo)arthritis: Secondary | ICD-10-CM

## 2014-07-19 DIAGNOSIS — M159 Polyosteoarthritis, unspecified: Secondary | ICD-10-CM

## 2014-07-19 DIAGNOSIS — E1141 Type 2 diabetes mellitus with diabetic mononeuropathy: Secondary | ICD-10-CM

## 2014-07-19 NOTE — Progress Notes (Signed)
   Subjective:    Patient ID: Patricia Lane, female    DOB: 1950-11-11, 63 y.o.   MRN: [id number]  HPI I AM HERE TO GET MY DIABETIC SHOES   Review of Systems no new findings or systemic changes     Objective:   Physical Exam Neurovascular status unchanged patient does have neuropathy decreased sensation has digital deformities of keratoses diabetes complications. One pair of extra depth shoes and 3 pairs of multi-density dual density Plastizote inlays are dispensed shoes fit and contour well to the foot inlays have full contact the plantar surface of the foot from heel to toes. Patient given oral and written instructions for use and break and adjustments in the future as needed       Assessment & Plan:  Assessment diabetes with complications peripheral neuropathy and angiopathy. Diabetic shoes are dispensed accommodate deformity no cyst with gait and prevent breakdown keratoses. Given instructions for use recheck in the future as needed for for a palliative care  Harriet Masson DPM

## 2014-09-12 DIAGNOSIS — J0100 Acute maxillary sinusitis, unspecified: Secondary | ICD-10-CM | POA: Diagnosis not present

## 2014-09-12 DIAGNOSIS — H6121 Impacted cerumen, right ear: Secondary | ICD-10-CM | POA: Diagnosis not present

## 2014-10-04 DIAGNOSIS — M6240 Contracture of muscle, unspecified site: Secondary | ICD-10-CM | POA: Diagnosis not present

## 2014-10-04 DIAGNOSIS — K219 Gastro-esophageal reflux disease without esophagitis: Secondary | ICD-10-CM | POA: Diagnosis not present

## 2014-10-04 DIAGNOSIS — E780 Pure hypercholesterolemia: Secondary | ICD-10-CM | POA: Diagnosis not present

## 2014-10-04 DIAGNOSIS — I10 Essential (primary) hypertension: Secondary | ICD-10-CM | POA: Diagnosis not present

## 2014-10-04 DIAGNOSIS — M62838 Other muscle spasm: Secondary | ICD-10-CM | POA: Diagnosis not present

## 2014-10-04 DIAGNOSIS — E1140 Type 2 diabetes mellitus with diabetic neuropathy, unspecified: Secondary | ICD-10-CM | POA: Diagnosis not present

## 2014-10-17 DIAGNOSIS — M62838 Other muscle spasm: Secondary | ICD-10-CM | POA: Diagnosis not present

## 2014-10-17 DIAGNOSIS — M79604 Pain in right leg: Secondary | ICD-10-CM | POA: Diagnosis not present

## 2014-10-17 DIAGNOSIS — E119 Type 2 diabetes mellitus without complications: Secondary | ICD-10-CM | POA: Diagnosis not present

## 2014-10-17 DIAGNOSIS — I10 Essential (primary) hypertension: Secondary | ICD-10-CM | POA: Diagnosis not present

## 2014-10-17 DIAGNOSIS — M79605 Pain in left leg: Secondary | ICD-10-CM | POA: Diagnosis not present

## 2014-11-01 DIAGNOSIS — B029 Zoster without complications: Secondary | ICD-10-CM | POA: Diagnosis not present

## 2014-11-01 DIAGNOSIS — R21 Rash and other nonspecific skin eruption: Secondary | ICD-10-CM | POA: Diagnosis not present

## 2015-01-16 DIAGNOSIS — E1140 Type 2 diabetes mellitus with diabetic neuropathy, unspecified: Secondary | ICD-10-CM | POA: Diagnosis not present

## 2015-01-16 DIAGNOSIS — I10 Essential (primary) hypertension: Secondary | ICD-10-CM | POA: Diagnosis not present

## 2015-01-16 DIAGNOSIS — F5101 Primary insomnia: Secondary | ICD-10-CM | POA: Diagnosis not present

## 2015-01-16 DIAGNOSIS — E780 Pure hypercholesterolemia: Secondary | ICD-10-CM | POA: Diagnosis not present

## 2015-02-14 DIAGNOSIS — Z23 Encounter for immunization: Secondary | ICD-10-CM | POA: Diagnosis not present

## 2015-03-02 DIAGNOSIS — J069 Acute upper respiratory infection, unspecified: Secondary | ICD-10-CM | POA: Diagnosis not present

## 2015-03-25 DIAGNOSIS — E119 Type 2 diabetes mellitus without complications: Secondary | ICD-10-CM | POA: Diagnosis not present

## 2015-04-05 DIAGNOSIS — R05 Cough: Secondary | ICD-10-CM | POA: Diagnosis not present

## 2015-04-05 DIAGNOSIS — J0100 Acute maxillary sinusitis, unspecified: Secondary | ICD-10-CM | POA: Diagnosis not present

## 2015-04-05 DIAGNOSIS — J0110 Acute frontal sinusitis, unspecified: Secondary | ICD-10-CM | POA: Diagnosis not present

## 2015-04-18 DIAGNOSIS — E7800 Pure hypercholesterolemia, unspecified: Secondary | ICD-10-CM | POA: Diagnosis not present

## 2015-04-18 DIAGNOSIS — F5101 Primary insomnia: Secondary | ICD-10-CM | POA: Diagnosis not present

## 2015-04-18 DIAGNOSIS — E1140 Type 2 diabetes mellitus with diabetic neuropathy, unspecified: Secondary | ICD-10-CM | POA: Diagnosis not present

## 2015-04-18 DIAGNOSIS — I10 Essential (primary) hypertension: Secondary | ICD-10-CM | POA: Diagnosis not present

## 2015-07-08 DIAGNOSIS — Z1231 Encounter for screening mammogram for malignant neoplasm of breast: Secondary | ICD-10-CM | POA: Diagnosis not present

## 2015-07-17 DIAGNOSIS — E7800 Pure hypercholesterolemia, unspecified: Secondary | ICD-10-CM | POA: Diagnosis not present

## 2015-07-17 DIAGNOSIS — E1140 Type 2 diabetes mellitus with diabetic neuropathy, unspecified: Secondary | ICD-10-CM | POA: Diagnosis not present

## 2015-07-17 DIAGNOSIS — S7002XA Contusion of left hip, initial encounter: Secondary | ICD-10-CM | POA: Diagnosis not present

## 2015-07-17 DIAGNOSIS — I10 Essential (primary) hypertension: Secondary | ICD-10-CM | POA: Diagnosis not present

## 2015-07-17 DIAGNOSIS — E11319 Type 2 diabetes mellitus with unspecified diabetic retinopathy without macular edema: Secondary | ICD-10-CM | POA: Diagnosis not present

## 2015-07-17 DIAGNOSIS — Z79891 Long term (current) use of opiate analgesic: Secondary | ICD-10-CM | POA: Diagnosis not present

## 2015-07-19 DIAGNOSIS — E1140 Type 2 diabetes mellitus with diabetic neuropathy, unspecified: Secondary | ICD-10-CM | POA: Diagnosis not present

## 2015-07-19 DIAGNOSIS — E7800 Pure hypercholesterolemia, unspecified: Secondary | ICD-10-CM | POA: Diagnosis not present

## 2015-07-19 DIAGNOSIS — Z79891 Long term (current) use of opiate analgesic: Secondary | ICD-10-CM | POA: Diagnosis not present

## 2015-08-01 DIAGNOSIS — N760 Acute vaginitis: Secondary | ICD-10-CM | POA: Diagnosis not present

## 2015-08-31 DIAGNOSIS — L0390 Cellulitis, unspecified: Secondary | ICD-10-CM | POA: Diagnosis not present

## 2015-10-16 DIAGNOSIS — Z79891 Long term (current) use of opiate analgesic: Secondary | ICD-10-CM | POA: Diagnosis not present

## 2015-10-16 DIAGNOSIS — E7800 Pure hypercholesterolemia, unspecified: Secondary | ICD-10-CM | POA: Diagnosis not present

## 2015-10-16 DIAGNOSIS — I10 Essential (primary) hypertension: Secondary | ICD-10-CM | POA: Diagnosis not present

## 2015-10-16 DIAGNOSIS — E11319 Type 2 diabetes mellitus with unspecified diabetic retinopathy without macular edema: Secondary | ICD-10-CM | POA: Diagnosis not present

## 2015-10-16 DIAGNOSIS — Z0000 Encounter for general adult medical examination without abnormal findings: Secondary | ICD-10-CM | POA: Diagnosis not present

## 2015-10-16 DIAGNOSIS — E1140 Type 2 diabetes mellitus with diabetic neuropathy, unspecified: Secondary | ICD-10-CM | POA: Diagnosis not present

## 2015-10-16 DIAGNOSIS — Z6841 Body Mass Index (BMI) 40.0 and over, adult: Secondary | ICD-10-CM | POA: Diagnosis not present

## 2015-10-16 DIAGNOSIS — Z1211 Encounter for screening for malignant neoplasm of colon: Secondary | ICD-10-CM | POA: Diagnosis not present

## 2015-11-18 DIAGNOSIS — E119 Type 2 diabetes mellitus without complications: Secondary | ICD-10-CM | POA: Diagnosis not present

## 2015-12-19 DIAGNOSIS — M7061 Trochanteric bursitis, right hip: Secondary | ICD-10-CM | POA: Diagnosis not present

## 2016-01-13 DIAGNOSIS — E1140 Type 2 diabetes mellitus with diabetic neuropathy, unspecified: Secondary | ICD-10-CM | POA: Diagnosis not present

## 2016-01-13 DIAGNOSIS — I10 Essential (primary) hypertension: Secondary | ICD-10-CM | POA: Diagnosis not present

## 2016-01-13 DIAGNOSIS — E11319 Type 2 diabetes mellitus with unspecified diabetic retinopathy without macular edema: Secondary | ICD-10-CM | POA: Diagnosis not present

## 2016-01-13 DIAGNOSIS — Z1211 Encounter for screening for malignant neoplasm of colon: Secondary | ICD-10-CM | POA: Diagnosis not present

## 2016-01-14 DIAGNOSIS — E11319 Type 2 diabetes mellitus with unspecified diabetic retinopathy without macular edema: Secondary | ICD-10-CM | POA: Diagnosis not present

## 2016-01-14 DIAGNOSIS — E1139 Type 2 diabetes mellitus with other diabetic ophthalmic complication: Secondary | ICD-10-CM | POA: Diagnosis not present

## 2016-02-18 DIAGNOSIS — Z23 Encounter for immunization: Secondary | ICD-10-CM | POA: Diagnosis not present

## 2016-03-10 DIAGNOSIS — J0100 Acute maxillary sinusitis, unspecified: Secondary | ICD-10-CM | POA: Diagnosis not present

## 2016-03-10 DIAGNOSIS — R05 Cough: Secondary | ICD-10-CM | POA: Diagnosis not present

## 2016-05-11 DIAGNOSIS — Z961 Presence of intraocular lens: Secondary | ICD-10-CM | POA: Diagnosis not present

## 2016-05-11 DIAGNOSIS — H26492 Other secondary cataract, left eye: Secondary | ICD-10-CM | POA: Diagnosis not present

## 2016-05-11 DIAGNOSIS — E113299 Type 2 diabetes mellitus with mild nonproliferative diabetic retinopathy without macular edema, unspecified eye: Secondary | ICD-10-CM | POA: Diagnosis not present

## 2016-05-28 DIAGNOSIS — B372 Candidiasis of skin and nail: Secondary | ICD-10-CM | POA: Diagnosis not present

## 2016-05-28 DIAGNOSIS — I10 Essential (primary) hypertension: Secondary | ICD-10-CM | POA: Diagnosis not present

## 2016-05-28 DIAGNOSIS — E1140 Type 2 diabetes mellitus with diabetic neuropathy, unspecified: Secondary | ICD-10-CM | POA: Diagnosis not present

## 2016-05-28 DIAGNOSIS — E11319 Type 2 diabetes mellitus with unspecified diabetic retinopathy without macular edema: Secondary | ICD-10-CM | POA: Diagnosis not present

## 2016-05-28 DIAGNOSIS — G250 Essential tremor: Secondary | ICD-10-CM | POA: Diagnosis not present

## 2016-06-09 DIAGNOSIS — R05 Cough: Secondary | ICD-10-CM | POA: Diagnosis not present

## 2016-06-09 DIAGNOSIS — J0100 Acute maxillary sinusitis, unspecified: Secondary | ICD-10-CM | POA: Diagnosis not present

## 2016-07-01 ENCOUNTER — Encounter: Payer: Self-pay | Admitting: Sports Medicine

## 2016-07-01 ENCOUNTER — Ambulatory Visit (INDEPENDENT_AMBULATORY_CARE_PROVIDER_SITE_OTHER): Payer: Medicare HMO | Admitting: Sports Medicine

## 2016-07-01 DIAGNOSIS — L601 Onycholysis: Secondary | ICD-10-CM

## 2016-07-01 DIAGNOSIS — E119 Type 2 diabetes mellitus without complications: Secondary | ICD-10-CM | POA: Insufficient documentation

## 2016-07-01 DIAGNOSIS — G8929 Other chronic pain: Secondary | ICD-10-CM | POA: Insufficient documentation

## 2016-07-01 DIAGNOSIS — E1140 Type 2 diabetes mellitus with diabetic neuropathy, unspecified: Secondary | ICD-10-CM | POA: Diagnosis not present

## 2016-07-01 DIAGNOSIS — M549 Dorsalgia, unspecified: Secondary | ICD-10-CM

## 2016-07-01 DIAGNOSIS — I10 Essential (primary) hypertension: Secondary | ICD-10-CM | POA: Insufficient documentation

## 2016-07-01 NOTE — Progress Notes (Signed)
Subjective: Patricia Lane is a 66 y.o. female patient with history of diabetes who presents to office today complaining of long, painful nails while ambulating in shoes; unable to trim. Patient states that she was concerned of the appearance of the nails because she is diabetic noticed that the left toenail had a little area of dark blood and wanted to have it checked out before she decided to keep getting pedicure.  Patient Active Problem List   Diagnosis Date Noted  . Diabetes mellitus ([place]) 07/01/2016  . Hypertension 07/01/2016  . Chronic back pain 07/01/2016   Current Outpatient Prescriptions on File Prior to Visit  Medication Sig Dispense Refill  . amitriptyline (ELAVIL) 25 MG tablet     . aspirin 81 MG tablet Take 81 mg by mouth daily.    . Cranberry 1000 MG CAPS Take 4,200 mg by mouth.    . Exenatide (BYDUREON [place]) Inject 2 mg into the skin.    Marland Kitchen FARXIGA 10 MG TABS tablet     . FARXIGA 5 MG TABS     . fish oil-omega-3 fatty acids 1000 MG capsule Take 2 g by mouth daily.    Marland Kitchen gabapentin (NEURONTIN) 400 MG capsule     . gabapentin (NEURONTIN) 400 MG capsule Take 400 mg by mouth.    . insulin glargine (LANTUS) 100 UNIT/ML injection Inject into the skin at bedtime.    . Lactobacillus (FLORAJEN ACIDOPHILUS) CAPS Take by mouth.    . losartan (COZAAR) 100 MG tablet Take 100 mg by mouth daily.    Marland Kitchen losartan (COZAAR) 25 MG tablet Take 25 mg by mouth.    . metFORMIN (GLUCOPHAGE) 500 MG tablet     . metFORMIN (GLUCOPHAGE) 500 MG tablet Take 500 mg by mouth.    . metoprolol succinate (TOPROL-XL) 100 MG 24 hr tablet Take 100 mg by mouth daily. Take with or immediately following a meal.    . metoprolol succinate (TOPROL-XL) 50 MG 24 hr tablet Take 50 mg by mouth.    Marland Kitchen NOVOLIN N RELION 100 UNIT/ML injection     . omeprazole (PRILOSEC) 20 MG capsule Take 20 mg by mouth daily.    . Red Yeast Rice 600 MG CAPS Take 1,200 mg by mouth.    . traMADol (ULTRAM) 50 MG tablet     . traMADol (ULTRAM) 50 MG  tablet Take 50 mg by mouth.     No current facility-administered medications on file prior to visit.    Allergies  Allergen Reactions  . Codeine Itching and Rash  . Demerol [Meperidine] Itching  . Penicillins Itching and Rash    No results found for this or any previous visit (from the past 2160 hour(s)).  Objective: General: Patient is awake, alert, and oriented x 3 and in no acute distress.  Integument: Skin is warm, dry and supple bilateral. Nails are tender, long, thickened and dystrophic with subungual debris, consistent with onychomycosis With distal lysis and dry blood underneath the left hallux and trauma lines consisted of microtrauma left greater than right hallux; all other nails within normal limits. No signs of infection. No open lesions or preulcerative lesions present bilateral. Remaining integument unremarkable.  Vasculature:  Dorsalis Pedis pulse 2/4 bilateral. Posterior Tibial pulse  2/4 bilateral.  Capillary fill time <3 sec 1-5 bilateral. Positive hair growth to the level of the digits. Temperature gradient within normal limits. No varicosities present bilateral. No edema present bilateral.   Neurology: The patient has intact sensation measured with a 5.07/10g  Semmes Weinstein Monofilament at all pedal sites bilateral . Vibratory sensation diminished bilateral with tuning fork. No Babinski sign present bilateral.   Musculoskeletal: No symptomatic pedal deformities noted bilateral. Muscular strength 5/5 in all lower extremity muscular groups bilateral without pain on range of motion . No tenderness with calf compression bilateral.  Assessment and Plan: Problem List Items Addressed This Visit    None    Visit Diagnoses    Onycholysis    -  Primary   Type 2 diabetes, controlled, with neuropathy ([place])          -Examined patient. -Discussed and educated patient on diabetic foot care, especially with  regards to the vascular, neurological and musculoskeletal  systems.  -Stressed the importance of good glycemic control and the detriment of not controlling glucose levels in relation to the foot. -Mechanically debrided Hallux nails using sterile nail nipper and filed with dremel without incident; advised patient that these traumatized nails may slowly shed off over time and to closely monitor them. Gave patient approval for pedicure, however. Advised patient to stick with light nail color polish and to give nails a break and not keep nail polish on longer than 2 weeks at a time. -Answered all patient questions -Patient to return as needed -Patient advised to call the office if any problems or questions arise in the meantime.  Landis Martins, DPM

## 2016-09-11 DIAGNOSIS — E669 Obesity, unspecified: Secondary | ICD-10-CM | POA: Diagnosis not present

## 2016-09-11 DIAGNOSIS — E1140 Type 2 diabetes mellitus with diabetic neuropathy, unspecified: Secondary | ICD-10-CM | POA: Diagnosis not present

## 2016-09-11 DIAGNOSIS — E11319 Type 2 diabetes mellitus with unspecified diabetic retinopathy without macular edema: Secondary | ICD-10-CM | POA: Diagnosis not present

## 2016-09-11 DIAGNOSIS — I10 Essential (primary) hypertension: Secondary | ICD-10-CM | POA: Diagnosis not present

## 2016-12-04 DIAGNOSIS — M545 Low back pain: Secondary | ICD-10-CM | POA: Diagnosis not present

## 2016-12-21 DIAGNOSIS — Z90710 Acquired absence of both cervix and uterus: Secondary | ICD-10-CM | POA: Diagnosis not present

## 2016-12-21 DIAGNOSIS — Z9181 History of falling: Secondary | ICD-10-CM | POA: Diagnosis not present

## 2016-12-21 DIAGNOSIS — Z0000 Encounter for general adult medical examination without abnormal findings: Secondary | ICD-10-CM | POA: Diagnosis not present

## 2016-12-21 DIAGNOSIS — Z6841 Body Mass Index (BMI) 40.0 and over, adult: Secondary | ICD-10-CM | POA: Diagnosis not present

## 2016-12-21 DIAGNOSIS — K219 Gastro-esophageal reflux disease without esophagitis: Secondary | ICD-10-CM | POA: Diagnosis not present

## 2016-12-21 DIAGNOSIS — F5101 Primary insomnia: Secondary | ICD-10-CM | POA: Diagnosis not present

## 2016-12-21 DIAGNOSIS — E1140 Type 2 diabetes mellitus with diabetic neuropathy, unspecified: Secondary | ICD-10-CM | POA: Diagnosis not present

## 2016-12-21 DIAGNOSIS — I10 Essential (primary) hypertension: Secondary | ICD-10-CM | POA: Diagnosis not present

## 2016-12-24 DIAGNOSIS — Z0000 Encounter for general adult medical examination without abnormal findings: Secondary | ICD-10-CM | POA: Diagnosis not present

## 2016-12-24 DIAGNOSIS — I10 Essential (primary) hypertension: Secondary | ICD-10-CM | POA: Diagnosis not present

## 2016-12-24 DIAGNOSIS — F5101 Primary insomnia: Secondary | ICD-10-CM | POA: Diagnosis not present

## 2016-12-24 DIAGNOSIS — K219 Gastro-esophageal reflux disease without esophagitis: Secondary | ICD-10-CM | POA: Diagnosis not present

## 2016-12-24 DIAGNOSIS — E1140 Type 2 diabetes mellitus with diabetic neuropathy, unspecified: Secondary | ICD-10-CM | POA: Diagnosis not present

## 2017-02-11 DIAGNOSIS — M545 Low back pain: Secondary | ICD-10-CM | POA: Diagnosis not present

## 2017-02-11 DIAGNOSIS — M7918 Myalgia, other site: Secondary | ICD-10-CM | POA: Diagnosis not present

## 2017-02-11 DIAGNOSIS — M47896 Other spondylosis, lumbar region: Secondary | ICD-10-CM | POA: Diagnosis not present

## 2017-02-12 DIAGNOSIS — Z23 Encounter for immunization: Secondary | ICD-10-CM | POA: Diagnosis not present

## 2017-02-26 DIAGNOSIS — M47896 Other spondylosis, lumbar region: Secondary | ICD-10-CM | POA: Diagnosis not present

## 2017-02-26 DIAGNOSIS — M545 Low back pain: Secondary | ICD-10-CM | POA: Diagnosis not present

## 2017-03-04 DIAGNOSIS — Z4689 Encounter for fitting and adjustment of other specified devices: Secondary | ICD-10-CM | POA: Diagnosis not present

## 2017-03-05 DIAGNOSIS — S32040A Wedge compression fracture of fourth lumbar vertebra, initial encounter for closed fracture: Secondary | ICD-10-CM | POA: Diagnosis not present

## 2017-03-26 DIAGNOSIS — J0100 Acute maxillary sinusitis, unspecified: Secondary | ICD-10-CM | POA: Diagnosis not present

## 2017-03-26 DIAGNOSIS — S32040A Wedge compression fracture of fourth lumbar vertebra, initial encounter for closed fracture: Secondary | ICD-10-CM | POA: Diagnosis not present

## 2017-03-31 DIAGNOSIS — Z1239 Encounter for other screening for malignant neoplasm of breast: Secondary | ICD-10-CM | POA: Diagnosis not present

## 2017-03-31 DIAGNOSIS — S32009A Unspecified fracture of unspecified lumbar vertebra, initial encounter for closed fracture: Secondary | ICD-10-CM | POA: Diagnosis not present

## 2017-03-31 DIAGNOSIS — I10 Essential (primary) hypertension: Secondary | ICD-10-CM | POA: Diagnosis not present

## 2017-03-31 DIAGNOSIS — Z79891 Long term (current) use of opiate analgesic: Secondary | ICD-10-CM | POA: Diagnosis not present

## 2017-03-31 DIAGNOSIS — E11319 Type 2 diabetes mellitus with unspecified diabetic retinopathy without macular edema: Secondary | ICD-10-CM | POA: Diagnosis not present

## 2017-03-31 DIAGNOSIS — E1140 Type 2 diabetes mellitus with diabetic neuropathy, unspecified: Secondary | ICD-10-CM | POA: Diagnosis not present

## 2017-04-30 DIAGNOSIS — Z13820 Encounter for screening for osteoporosis: Secondary | ICD-10-CM | POA: Diagnosis not present

## 2017-04-30 DIAGNOSIS — M8589 Other specified disorders of bone density and structure, multiple sites: Secondary | ICD-10-CM | POA: Diagnosis not present

## 2017-04-30 DIAGNOSIS — Z1231 Encounter for screening mammogram for malignant neoplasm of breast: Secondary | ICD-10-CM | POA: Diagnosis not present

## 2017-05-03 DIAGNOSIS — R079 Chest pain, unspecified: Secondary | ICD-10-CM | POA: Diagnosis not present

## 2017-05-03 DIAGNOSIS — S20212A Contusion of left front wall of thorax, initial encounter: Secondary | ICD-10-CM | POA: Diagnosis not present

## 2017-05-04 DIAGNOSIS — M545 Low back pain: Secondary | ICD-10-CM | POA: Diagnosis not present

## 2017-05-04 DIAGNOSIS — M47896 Other spondylosis, lumbar region: Secondary | ICD-10-CM | POA: Diagnosis not present

## 2017-05-04 DIAGNOSIS — M7918 Myalgia, other site: Secondary | ICD-10-CM | POA: Diagnosis not present

## 2017-05-20 DIAGNOSIS — H43811 Vitreous degeneration, right eye: Secondary | ICD-10-CM | POA: Diagnosis not present

## 2017-05-20 DIAGNOSIS — E103291 Type 1 diabetes mellitus with mild nonproliferative diabetic retinopathy without macular edema, right eye: Secondary | ICD-10-CM | POA: Diagnosis not present

## 2017-05-20 DIAGNOSIS — Z961 Presence of intraocular lens: Secondary | ICD-10-CM | POA: Diagnosis not present

## 2017-07-06 DIAGNOSIS — M545 Low back pain: Secondary | ICD-10-CM | POA: Diagnosis not present

## 2017-07-06 DIAGNOSIS — M47896 Other spondylosis, lumbar region: Secondary | ICD-10-CM | POA: Diagnosis not present

## 2017-07-13 DIAGNOSIS — H26492 Other secondary cataract, left eye: Secondary | ICD-10-CM | POA: Diagnosis not present

## 2017-07-14 DIAGNOSIS — J209 Acute bronchitis, unspecified: Secondary | ICD-10-CM | POA: Diagnosis not present

## 2017-08-05 DIAGNOSIS — Z6841 Body Mass Index (BMI) 40.0 and over, adult: Secondary | ICD-10-CM | POA: Diagnosis not present

## 2017-08-05 DIAGNOSIS — E1140 Type 2 diabetes mellitus with diabetic neuropathy, unspecified: Secondary | ICD-10-CM | POA: Diagnosis not present

## 2017-08-05 DIAGNOSIS — I10 Essential (primary) hypertension: Secondary | ICD-10-CM | POA: Diagnosis not present

## 2017-08-11 DIAGNOSIS — E1140 Type 2 diabetes mellitus with diabetic neuropathy, unspecified: Secondary | ICD-10-CM | POA: Diagnosis not present

## 2017-08-17 DIAGNOSIS — M545 Low back pain: Secondary | ICD-10-CM | POA: Diagnosis not present

## 2017-08-17 DIAGNOSIS — M47896 Other spondylosis, lumbar region: Secondary | ICD-10-CM | POA: Diagnosis not present

## 2017-08-17 DIAGNOSIS — M7918 Myalgia, other site: Secondary | ICD-10-CM | POA: Diagnosis not present

## 2017-09-21 DIAGNOSIS — M545 Low back pain: Secondary | ICD-10-CM | POA: Diagnosis not present

## 2017-09-21 DIAGNOSIS — M47896 Other spondylosis, lumbar region: Secondary | ICD-10-CM | POA: Diagnosis not present

## 2017-12-02 DIAGNOSIS — M7918 Myalgia, other site: Secondary | ICD-10-CM | POA: Diagnosis not present

## 2017-12-02 DIAGNOSIS — M545 Low back pain: Secondary | ICD-10-CM | POA: Diagnosis not present

## 2017-12-02 DIAGNOSIS — M47896 Other spondylosis, lumbar region: Secondary | ICD-10-CM | POA: Diagnosis not present

## 2017-12-23 DIAGNOSIS — R350 Frequency of micturition: Secondary | ICD-10-CM | POA: Diagnosis not present

## 2017-12-23 DIAGNOSIS — Z794 Long term (current) use of insulin: Secondary | ICD-10-CM | POA: Diagnosis not present

## 2017-12-23 DIAGNOSIS — Z1211 Encounter for screening for malignant neoplasm of colon: Secondary | ICD-10-CM | POA: Diagnosis not present

## 2017-12-23 DIAGNOSIS — Z87448 Personal history of other diseases of urinary system: Secondary | ICD-10-CM | POA: Diagnosis not present

## 2017-12-23 DIAGNOSIS — E7800 Pure hypercholesterolemia, unspecified: Secondary | ICD-10-CM | POA: Diagnosis not present

## 2017-12-23 DIAGNOSIS — Z6841 Body Mass Index (BMI) 40.0 and over, adult: Secondary | ICD-10-CM | POA: Diagnosis not present

## 2017-12-23 DIAGNOSIS — K219 Gastro-esophageal reflux disease without esophagitis: Secondary | ICD-10-CM | POA: Diagnosis not present

## 2017-12-23 DIAGNOSIS — Z0000 Encounter for general adult medical examination without abnormal findings: Secondary | ICD-10-CM | POA: Diagnosis not present

## 2017-12-23 DIAGNOSIS — R110 Nausea: Secondary | ICD-10-CM | POA: Diagnosis not present

## 2017-12-23 DIAGNOSIS — I10 Essential (primary) hypertension: Secondary | ICD-10-CM | POA: Diagnosis not present

## 2017-12-23 DIAGNOSIS — Z9181 History of falling: Secondary | ICD-10-CM | POA: Diagnosis not present

## 2017-12-23 DIAGNOSIS — E1140 Type 2 diabetes mellitus with diabetic neuropathy, unspecified: Secondary | ICD-10-CM | POA: Diagnosis not present

## 2017-12-30 DIAGNOSIS — M25561 Pain in right knee: Secondary | ICD-10-CM | POA: Diagnosis not present

## 2017-12-30 DIAGNOSIS — M7918 Myalgia, other site: Secondary | ICD-10-CM | POA: Diagnosis not present

## 2017-12-30 DIAGNOSIS — M47816 Spondylosis without myelopathy or radiculopathy, lumbar region: Secondary | ICD-10-CM | POA: Diagnosis not present

## 2017-12-30 DIAGNOSIS — R269 Unspecified abnormalities of gait and mobility: Secondary | ICD-10-CM | POA: Diagnosis not present

## 2017-12-30 DIAGNOSIS — M545 Low back pain: Secondary | ICD-10-CM | POA: Diagnosis not present

## 2017-12-31 DIAGNOSIS — E1140 Type 2 diabetes mellitus with diabetic neuropathy, unspecified: Secondary | ICD-10-CM | POA: Diagnosis not present

## 2017-12-31 DIAGNOSIS — E7800 Pure hypercholesterolemia, unspecified: Secondary | ICD-10-CM | POA: Diagnosis not present

## 2017-12-31 DIAGNOSIS — Z794 Long term (current) use of insulin: Secondary | ICD-10-CM | POA: Diagnosis not present

## 2017-12-31 DIAGNOSIS — Z6841 Body Mass Index (BMI) 40.0 and over, adult: Secondary | ICD-10-CM | POA: Diagnosis not present

## 2017-12-31 DIAGNOSIS — Z0000 Encounter for general adult medical examination without abnormal findings: Secondary | ICD-10-CM | POA: Diagnosis not present

## 2017-12-31 DIAGNOSIS — I10 Essential (primary) hypertension: Secondary | ICD-10-CM | POA: Diagnosis not present

## 2017-12-31 DIAGNOSIS — K219 Gastro-esophageal reflux disease without esophagitis: Secondary | ICD-10-CM | POA: Diagnosis not present

## 2018-01-18 DIAGNOSIS — M47816 Spondylosis without myelopathy or radiculopathy, lumbar region: Secondary | ICD-10-CM | POA: Diagnosis not present

## 2018-01-29 DIAGNOSIS — Z23 Encounter for immunization: Secondary | ICD-10-CM | POA: Diagnosis not present

## 2018-02-02 DIAGNOSIS — M47816 Spondylosis without myelopathy or radiculopathy, lumbar region: Secondary | ICD-10-CM | POA: Diagnosis not present

## 2018-03-01 DIAGNOSIS — R110 Nausea: Secondary | ICD-10-CM | POA: Diagnosis not present

## 2018-03-01 DIAGNOSIS — J0100 Acute maxillary sinusitis, unspecified: Secondary | ICD-10-CM | POA: Diagnosis not present

## 2018-03-01 DIAGNOSIS — S4992XA Unspecified injury of left shoulder and upper arm, initial encounter: Secondary | ICD-10-CM | POA: Diagnosis not present

## 2018-03-09 DIAGNOSIS — M47816 Spondylosis without myelopathy or radiculopathy, lumbar region: Secondary | ICD-10-CM | POA: Diagnosis not present

## 2018-03-09 DIAGNOSIS — M47817 Spondylosis without myelopathy or radiculopathy, lumbosacral region: Secondary | ICD-10-CM | POA: Diagnosis not present

## 2018-04-04 DIAGNOSIS — K219 Gastro-esophageal reflux disease without esophagitis: Secondary | ICD-10-CM | POA: Diagnosis not present

## 2018-04-04 DIAGNOSIS — E11319 Type 2 diabetes mellitus with unspecified diabetic retinopathy without macular edema: Secondary | ICD-10-CM | POA: Diagnosis not present

## 2018-04-04 DIAGNOSIS — E7800 Pure hypercholesterolemia, unspecified: Secondary | ICD-10-CM | POA: Diagnosis not present

## 2018-04-04 DIAGNOSIS — E1140 Type 2 diabetes mellitus with diabetic neuropathy, unspecified: Secondary | ICD-10-CM | POA: Diagnosis not present

## 2018-04-04 DIAGNOSIS — Z794 Long term (current) use of insulin: Secondary | ICD-10-CM | POA: Diagnosis not present

## 2018-04-04 DIAGNOSIS — I10 Essential (primary) hypertension: Secondary | ICD-10-CM | POA: Diagnosis not present

## 2018-04-06 DIAGNOSIS — M545 Low back pain: Secondary | ICD-10-CM | POA: Diagnosis not present

## 2018-04-06 DIAGNOSIS — M47816 Spondylosis without myelopathy or radiculopathy, lumbar region: Secondary | ICD-10-CM | POA: Diagnosis not present

## 2018-04-12 DIAGNOSIS — M5136 Other intervertebral disc degeneration, lumbar region: Secondary | ICD-10-CM | POA: Diagnosis not present

## 2018-05-09 DIAGNOSIS — R1084 Generalized abdominal pain: Secondary | ICD-10-CM | POA: Diagnosis not present

## 2018-05-18 DIAGNOSIS — M5136 Other intervertebral disc degeneration, lumbar region: Secondary | ICD-10-CM | POA: Diagnosis not present

## 2018-05-18 DIAGNOSIS — M7918 Myalgia, other site: Secondary | ICD-10-CM | POA: Diagnosis not present

## 2018-05-18 DIAGNOSIS — M545 Low back pain: Secondary | ICD-10-CM | POA: Diagnosis not present

## 2018-06-01 DIAGNOSIS — M50322 Other cervical disc degeneration at C5-C6 level: Secondary | ICD-10-CM | POA: Diagnosis not present

## 2018-06-01 DIAGNOSIS — M5136 Other intervertebral disc degeneration, lumbar region: Secondary | ICD-10-CM | POA: Diagnosis not present

## 2018-06-06 DIAGNOSIS — J22 Unspecified acute lower respiratory infection: Secondary | ICD-10-CM | POA: Diagnosis not present

## 2018-06-08 DIAGNOSIS — J22 Unspecified acute lower respiratory infection: Secondary | ICD-10-CM | POA: Diagnosis not present

## 2018-06-08 DIAGNOSIS — J0100 Acute maxillary sinusitis, unspecified: Secondary | ICD-10-CM | POA: Diagnosis not present

## 2018-06-10 DIAGNOSIS — Z1231 Encounter for screening mammogram for malignant neoplasm of breast: Secondary | ICD-10-CM | POA: Diagnosis not present

## 2018-06-13 DIAGNOSIS — J22 Unspecified acute lower respiratory infection: Secondary | ICD-10-CM | POA: Diagnosis not present

## 2018-06-23 DIAGNOSIS — M47816 Spondylosis without myelopathy or radiculopathy, lumbar region: Secondary | ICD-10-CM | POA: Diagnosis not present

## 2018-06-23 DIAGNOSIS — M545 Low back pain: Secondary | ICD-10-CM | POA: Diagnosis not present

## 2018-06-23 DIAGNOSIS — Z6838 Body mass index (BMI) 38.0-38.9, adult: Secondary | ICD-10-CM | POA: Diagnosis not present

## 2018-06-23 DIAGNOSIS — M5136 Other intervertebral disc degeneration, lumbar region: Secondary | ICD-10-CM | POA: Diagnosis not present

## 2018-07-18 DIAGNOSIS — E7800 Pure hypercholesterolemia, unspecified: Secondary | ICD-10-CM | POA: Diagnosis not present

## 2018-07-18 DIAGNOSIS — E11319 Type 2 diabetes mellitus with unspecified diabetic retinopathy without macular edema: Secondary | ICD-10-CM | POA: Diagnosis not present

## 2018-07-18 DIAGNOSIS — E1140 Type 2 diabetes mellitus with diabetic neuropathy, unspecified: Secondary | ICD-10-CM | POA: Diagnosis not present

## 2018-07-18 DIAGNOSIS — Z6841 Body Mass Index (BMI) 40.0 and over, adult: Secondary | ICD-10-CM | POA: Diagnosis not present

## 2018-07-18 DIAGNOSIS — I10 Essential (primary) hypertension: Secondary | ICD-10-CM | POA: Diagnosis not present

## 2018-10-04 DIAGNOSIS — Z794 Long term (current) use of insulin: Secondary | ICD-10-CM | POA: Diagnosis not present

## 2018-10-04 DIAGNOSIS — Z961 Presence of intraocular lens: Secondary | ICD-10-CM | POA: Diagnosis not present

## 2018-10-04 DIAGNOSIS — E113293 Type 2 diabetes mellitus with mild nonproliferative diabetic retinopathy without macular edema, bilateral: Secondary | ICD-10-CM | POA: Diagnosis not present

## 2018-10-17 DIAGNOSIS — E1140 Type 2 diabetes mellitus with diabetic neuropathy, unspecified: Secondary | ICD-10-CM | POA: Diagnosis not present

## 2018-10-17 DIAGNOSIS — Z6841 Body Mass Index (BMI) 40.0 and over, adult: Secondary | ICD-10-CM | POA: Diagnosis not present

## 2018-10-17 DIAGNOSIS — E113293 Type 2 diabetes mellitus with mild nonproliferative diabetic retinopathy without macular edema, bilateral: Secondary | ICD-10-CM | POA: Diagnosis not present

## 2018-10-17 DIAGNOSIS — E7800 Pure hypercholesterolemia, unspecified: Secondary | ICD-10-CM | POA: Diagnosis not present

## 2018-10-17 DIAGNOSIS — I10 Essential (primary) hypertension: Secondary | ICD-10-CM | POA: Diagnosis not present

## 2018-10-17 DIAGNOSIS — M79606 Pain in leg, unspecified: Secondary | ICD-10-CM | POA: Diagnosis not present

## 2018-10-17 DIAGNOSIS — Z794 Long term (current) use of insulin: Secondary | ICD-10-CM | POA: Diagnosis not present

## 2018-10-21 DIAGNOSIS — I10 Essential (primary) hypertension: Secondary | ICD-10-CM | POA: Diagnosis not present

## 2018-10-21 DIAGNOSIS — E1140 Type 2 diabetes mellitus with diabetic neuropathy, unspecified: Secondary | ICD-10-CM | POA: Diagnosis not present

## 2018-10-21 DIAGNOSIS — E113293 Type 2 diabetes mellitus with mild nonproliferative diabetic retinopathy without macular edema, bilateral: Secondary | ICD-10-CM | POA: Diagnosis not present

## 2018-10-21 DIAGNOSIS — E11319 Type 2 diabetes mellitus with unspecified diabetic retinopathy without macular edema: Secondary | ICD-10-CM | POA: Diagnosis not present

## 2018-10-21 DIAGNOSIS — M79606 Pain in leg, unspecified: Secondary | ICD-10-CM | POA: Diagnosis not present

## 2018-10-21 DIAGNOSIS — K219 Gastro-esophageal reflux disease without esophagitis: Secondary | ICD-10-CM | POA: Diagnosis not present

## 2018-10-21 DIAGNOSIS — Z6841 Body Mass Index (BMI) 40.0 and over, adult: Secondary | ICD-10-CM | POA: Diagnosis not present

## 2018-10-21 DIAGNOSIS — E7800 Pure hypercholesterolemia, unspecified: Secondary | ICD-10-CM | POA: Diagnosis not present

## 2018-10-21 DIAGNOSIS — Z794 Long term (current) use of insulin: Secondary | ICD-10-CM | POA: Diagnosis not present

## 2018-12-26 DIAGNOSIS — Z6841 Body Mass Index (BMI) 40.0 and over, adult: Secondary | ICD-10-CM | POA: Diagnosis not present

## 2018-12-26 DIAGNOSIS — Z23 Encounter for immunization: Secondary | ICD-10-CM | POA: Diagnosis not present

## 2018-12-26 DIAGNOSIS — Z794 Long term (current) use of insulin: Secondary | ICD-10-CM | POA: Diagnosis not present

## 2018-12-26 DIAGNOSIS — M25579 Pain in unspecified ankle and joints of unspecified foot: Secondary | ICD-10-CM | POA: Diagnosis not present

## 2018-12-26 DIAGNOSIS — E1142 Type 2 diabetes mellitus with diabetic polyneuropathy: Secondary | ICD-10-CM | POA: Diagnosis not present

## 2018-12-26 DIAGNOSIS — K219 Gastro-esophageal reflux disease without esophagitis: Secondary | ICD-10-CM | POA: Diagnosis not present

## 2018-12-26 DIAGNOSIS — Z0000 Encounter for general adult medical examination without abnormal findings: Secondary | ICD-10-CM | POA: Diagnosis not present

## 2018-12-26 DIAGNOSIS — R350 Frequency of micturition: Secondary | ICD-10-CM | POA: Diagnosis not present

## 2018-12-26 DIAGNOSIS — Z9181 History of falling: Secondary | ICD-10-CM | POA: Diagnosis not present

## 2018-12-26 DIAGNOSIS — E113293 Type 2 diabetes mellitus with mild nonproliferative diabetic retinopathy without macular edema, bilateral: Secondary | ICD-10-CM | POA: Diagnosis not present

## 2019-02-20 DIAGNOSIS — Z23 Encounter for immunization: Secondary | ICD-10-CM | POA: Diagnosis not present

## 2019-02-27 DIAGNOSIS — K219 Gastro-esophageal reflux disease without esophagitis: Secondary | ICD-10-CM | POA: Diagnosis not present

## 2019-02-27 DIAGNOSIS — E11319 Type 2 diabetes mellitus with unspecified diabetic retinopathy without macular edema: Secondary | ICD-10-CM | POA: Diagnosis not present

## 2019-02-27 DIAGNOSIS — Z6841 Body Mass Index (BMI) 40.0 and over, adult: Secondary | ICD-10-CM | POA: Diagnosis not present

## 2019-02-27 DIAGNOSIS — E7800 Pure hypercholesterolemia, unspecified: Secondary | ICD-10-CM | POA: Diagnosis not present

## 2019-02-27 DIAGNOSIS — F341 Dysthymic disorder: Secondary | ICD-10-CM | POA: Diagnosis not present

## 2019-03-25 DIAGNOSIS — R1032 Left lower quadrant pain: Secondary | ICD-10-CM | POA: Diagnosis not present

## 2019-04-24 DIAGNOSIS — K5901 Slow transit constipation: Secondary | ICD-10-CM | POA: Diagnosis not present

## 2019-04-24 DIAGNOSIS — R1084 Generalized abdominal pain: Secondary | ICD-10-CM | POA: Diagnosis not present

## 2019-04-24 DIAGNOSIS — G2581 Restless legs syndrome: Secondary | ICD-10-CM | POA: Diagnosis not present

## 2019-05-30 DIAGNOSIS — E7800 Pure hypercholesterolemia, unspecified: Secondary | ICD-10-CM | POA: Diagnosis not present

## 2019-05-30 DIAGNOSIS — G250 Essential tremor: Secondary | ICD-10-CM | POA: Diagnosis not present

## 2019-05-30 DIAGNOSIS — E11319 Type 2 diabetes mellitus with unspecified diabetic retinopathy without macular edema: Secondary | ICD-10-CM | POA: Diagnosis not present

## 2019-05-30 DIAGNOSIS — F5101 Primary insomnia: Secondary | ICD-10-CM | POA: Diagnosis not present

## 2019-05-30 DIAGNOSIS — K219 Gastro-esophageal reflux disease without esophagitis: Secondary | ICD-10-CM | POA: Diagnosis not present

## 2019-05-30 DIAGNOSIS — G2581 Restless legs syndrome: Secondary | ICD-10-CM | POA: Diagnosis not present

## 2019-08-11 DIAGNOSIS — H35369 Drusen (degenerative) of macula, unspecified eye: Secondary | ICD-10-CM | POA: Diagnosis not present

## 2019-08-11 DIAGNOSIS — E109 Type 1 diabetes mellitus without complications: Secondary | ICD-10-CM | POA: Diagnosis not present

## 2019-08-28 DIAGNOSIS — D519 Vitamin B12 deficiency anemia, unspecified: Secondary | ICD-10-CM | POA: Diagnosis not present

## 2019-08-28 DIAGNOSIS — E7800 Pure hypercholesterolemia, unspecified: Secondary | ICD-10-CM | POA: Diagnosis not present

## 2019-08-28 DIAGNOSIS — E11319 Type 2 diabetes mellitus with unspecified diabetic retinopathy without macular edema: Secondary | ICD-10-CM | POA: Diagnosis not present

## 2019-09-07 DIAGNOSIS — Z043 Encounter for examination and observation following other accident: Secondary | ICD-10-CM | POA: Diagnosis not present

## 2019-09-07 DIAGNOSIS — S73101A Unspecified sprain of right hip, initial encounter: Secondary | ICD-10-CM | POA: Diagnosis not present

## 2019-09-07 DIAGNOSIS — M25551 Pain in right hip: Secondary | ICD-10-CM | POA: Diagnosis not present

## 2019-09-11 DIAGNOSIS — M5136 Other intervertebral disc degeneration, lumbar region: Secondary | ICD-10-CM | POA: Diagnosis not present

## 2019-09-11 DIAGNOSIS — M545 Low back pain: Secondary | ICD-10-CM | POA: Diagnosis not present

## 2019-09-11 DIAGNOSIS — Z6838 Body mass index (BMI) 38.0-38.9, adult: Secondary | ICD-10-CM | POA: Diagnosis not present

## 2019-09-11 DIAGNOSIS — M47816 Spondylosis without myelopathy or radiculopathy, lumbar region: Secondary | ICD-10-CM | POA: Diagnosis not present

## 2019-09-11 DIAGNOSIS — M7918 Myalgia, other site: Secondary | ICD-10-CM | POA: Diagnosis not present

## 2019-09-14 DIAGNOSIS — H353132 Nonexudative age-related macular degeneration, bilateral, intermediate dry stage: Secondary | ICD-10-CM | POA: Diagnosis not present

## 2019-09-14 DIAGNOSIS — E113293 Type 2 diabetes mellitus with mild nonproliferative diabetic retinopathy without macular edema, bilateral: Secondary | ICD-10-CM | POA: Diagnosis not present

## 2019-09-28 DIAGNOSIS — Z1231 Encounter for screening mammogram for malignant neoplasm of breast: Secondary | ICD-10-CM | POA: Diagnosis not present

## 2019-12-19 DIAGNOSIS — E11319 Type 2 diabetes mellitus with unspecified diabetic retinopathy without macular edema: Secondary | ICD-10-CM | POA: Diagnosis not present

## 2019-12-19 DIAGNOSIS — E7800 Pure hypercholesterolemia, unspecified: Secondary | ICD-10-CM | POA: Diagnosis not present

## 2019-12-19 DIAGNOSIS — Z6841 Body Mass Index (BMI) 40.0 and over, adult: Secondary | ICD-10-CM | POA: Diagnosis not present

## 2019-12-19 DIAGNOSIS — F341 Dysthymic disorder: Secondary | ICD-10-CM | POA: Diagnosis not present

## 2019-12-19 DIAGNOSIS — G2581 Restless legs syndrome: Secondary | ICD-10-CM | POA: Diagnosis not present

## 2019-12-19 DIAGNOSIS — K219 Gastro-esophageal reflux disease without esophagitis: Secondary | ICD-10-CM | POA: Diagnosis not present

## 2019-12-19 DIAGNOSIS — F5101 Primary insomnia: Secondary | ICD-10-CM | POA: Diagnosis not present

## 2019-12-19 DIAGNOSIS — R350 Frequency of micturition: Secondary | ICD-10-CM | POA: Diagnosis not present

## 2020-01-11 DIAGNOSIS — J0100 Acute maxillary sinusitis, unspecified: Secondary | ICD-10-CM | POA: Diagnosis not present

## 2020-01-11 DIAGNOSIS — R05 Cough: Secondary | ICD-10-CM | POA: Diagnosis not present

## 2020-01-12 DIAGNOSIS — U071 COVID-19: Secondary | ICD-10-CM | POA: Diagnosis not present

## 2020-01-14 ENCOUNTER — Ambulatory Visit (HOSPITAL_COMMUNITY)
Admission: RE | Admit: 2020-01-14 | Discharge: 2020-01-14 | Disposition: A | Discharge status: Home / Self Care | Payer: Medicare Other | Source: Ambulatory Visit | Attending: Pulmonary Disease | Admitting: Pulmonary Disease

## 2020-01-14 ENCOUNTER — Telehealth: Payer: Self-pay | Admitting: Unknown Physician Specialty

## 2020-01-14 ENCOUNTER — Other Ambulatory Visit: Payer: Self-pay | Admitting: Unknown Physician Specialty

## 2020-01-14 ENCOUNTER — Ambulatory Visit (HOSPITAL_COMMUNITY): Payer: Self-pay

## 2020-01-14 DIAGNOSIS — E119 Type 2 diabetes mellitus without complications: Secondary | ICD-10-CM | POA: Insufficient documentation

## 2020-01-14 DIAGNOSIS — U071 COVID-19: Secondary | ICD-10-CM | POA: Insufficient documentation

## 2020-01-14 DIAGNOSIS — I10 Essential (primary) hypertension: Secondary | ICD-10-CM | POA: Diagnosis present

## 2020-01-14 DIAGNOSIS — Z23 Encounter for immunization: Secondary | ICD-10-CM | POA: Diagnosis not present

## 2020-01-14 MED ORDER — METHYLPREDNISOLONE SODIUM SUCC 125 MG IJ SOLR: 125.0000 mg | Freq: Once | INTRAMUSCULAR | Status: DC | PRN

## 2020-01-14 MED ORDER — SODIUM CHLORIDE 0.9 % IV SOLN
1200.0000 mg | Freq: Once | INTRAVENOUS | Status: AC
  Administered 2020-01-14: 1200 mg via INTRAVENOUS

## 2020-01-14 MED ORDER — SODIUM CHLORIDE 0.9 % IV SOLN: INTRAVENOUS | Status: DC | PRN

## 2020-01-14 MED ORDER — FAMOTIDINE IN NACL 20-0.9 MG/50ML-% IV SOLN: 20.0000 mg | Freq: Once | INTRAVENOUS | Status: DC | PRN

## 2020-01-14 MED ORDER — ALBUTEROL SULFATE HFA 108 (90 BASE) MCG/ACT IN AERS: 2.0000 | INHALATION_SPRAY | Freq: Once | RESPIRATORY_TRACT | Status: DC | PRN

## 2020-01-14 MED ORDER — DIPHENHYDRAMINE HCL 50 MG/ML IJ SOLN: 50.0000 mg | Freq: Once | INTRAMUSCULAR | Status: DC | PRN

## 2020-01-14 MED ORDER — EPINEPHRINE 0.3 MG/0.3ML IJ SOAJ: 0.3000 mg | Freq: Once | INTRAMUSCULAR | Status: DC | PRN

## 2020-01-14 NOTE — Discharge Instructions (Signed)

## 2020-01-14 NOTE — Progress Notes (Signed)
Sx onset 9/14

## 2020-01-14 NOTE — Telephone Encounter (Signed)
I connected by phone with Patricia Lane on 01/14/2020 at 11:26 AM to discuss the potential use of a new treatment for mild to moderate COVID-19 viral infection in non-hospitalized patients.  This patient is a 69 y.o. female that meets the FDA criteria for Emergency Use Authorization of COVID monoclonal antibody casirivimab/imdevimab.  Has a (+) direct SARS-CoV-2 viral test result  Has mild or moderate COVID-19   Is NOT hospitalized due to COVID-19  Is within 10 days of symptom onset  Has at least one of the high risk factor(s) for progression to severe COVID-19 and/or hospitalization as defined in EUA.  Specific high risk criteria : Older age (>/= 68 yo), BMI > 25 and Diabetes   I have spoken and communicated the following to the patient or parent/caregiver regarding COVID monoclonal antibody treatment:  1. FDA has authorized the emergency use for the treatment of mild to moderate COVID-19 in adults and pediatric patients with positive results of direct SARS-CoV-2 viral testing who are 5 years of age and older weighing at least 40 kg, and who are at high risk for progressing to severe COVID-19 and/or hospitalization.  2. The significant known and potential risks and benefits of COVID monoclonal antibody, and the extent to which such potential risks and benefits are unknown.  3. Information on available alternative treatments and the risks and benefits of those alternatives, including clinical trials.  4. Patients treated with COVID monoclonal antibody should continue to self-isolate and use infection control measures (e.g., wear mask, isolate, social distance, avoid sharing personal items, clean and disinfect "high touch" surfaces, and frequent handwashing) according to CDC guidelines.   5. The patient or parent/caregiver has the option to accept or refuse COVID monoclonal antibody treatment.  After reviewing this information with the patient, The patient agreed to proceed with receiving  casirivimab\imdevimab infusion and will be provided a copy of the Fact sheet prior to receiving the infusion. [name] 01/14/2020 11:26 AM  Sx onset 9/14

## 2020-01-14 NOTE — Progress Notes (Addendum)
  Diagnosis: COVID-19  Physician: Dr. Asencion Noble  Procedure: [facility] Med: casirivimab\imdevimab infusion - Provided patient with casirivimab\imdevimab fact sheet for patients, parents and caregivers prior to infusion.  Complications: No immediate complications noted.  Discharge: Discharged home   Patricia Lane 01/14/2020

## 2020-02-01 DIAGNOSIS — D234 Other benign neoplasm of skin of scalp and neck: Secondary | ICD-10-CM | POA: Diagnosis not present

## 2020-02-07 DIAGNOSIS — M25579 Pain in unspecified ankle and joints of unspecified foot: Secondary | ICD-10-CM | POA: Diagnosis not present

## 2020-02-07 DIAGNOSIS — Z23 Encounter for immunization: Secondary | ICD-10-CM | POA: Diagnosis not present

## 2020-02-21 ENCOUNTER — Encounter: Payer: Self-pay | Admitting: Sports Medicine

## 2020-02-21 ENCOUNTER — Other Ambulatory Visit: Payer: Self-pay | Admitting: *Deleted

## 2020-02-21 ENCOUNTER — Other Ambulatory Visit: Payer: Self-pay

## 2020-02-21 ENCOUNTER — Ambulatory Visit (INDEPENDENT_AMBULATORY_CARE_PROVIDER_SITE_OTHER): Payer: Medicare HMO

## 2020-02-21 ENCOUNTER — Ambulatory Visit: Payer: Medicare HMO | Admitting: Sports Medicine

## 2020-02-21 DIAGNOSIS — M216X2 Other acquired deformities of left foot: Secondary | ICD-10-CM | POA: Diagnosis not present

## 2020-02-21 DIAGNOSIS — M7752 Other enthesopathy of left foot: Secondary | ICD-10-CM | POA: Diagnosis not present

## 2020-02-21 DIAGNOSIS — M2142 Flat foot [pes planus] (acquired), left foot: Secondary | ICD-10-CM

## 2020-02-21 DIAGNOSIS — R2681 Unsteadiness on feet: Secondary | ICD-10-CM | POA: Diagnosis not present

## 2020-02-21 DIAGNOSIS — E1140 Type 2 diabetes mellitus with diabetic neuropathy, unspecified: Secondary | ICD-10-CM | POA: Diagnosis not present

## 2020-02-21 DIAGNOSIS — M19072 Primary osteoarthritis, left ankle and foot: Secondary | ICD-10-CM

## 2020-02-21 DIAGNOSIS — M216X1 Other acquired deformities of right foot: Secondary | ICD-10-CM | POA: Diagnosis not present

## 2020-02-21 DIAGNOSIS — M2141 Flat foot [pes planus] (acquired), right foot: Secondary | ICD-10-CM | POA: Diagnosis not present

## 2020-02-21 DIAGNOSIS — Z9181 History of falling: Secondary | ICD-10-CM

## 2020-02-21 DIAGNOSIS — M1990 Unspecified osteoarthritis, unspecified site: Secondary | ICD-10-CM

## 2020-02-21 DIAGNOSIS — M779 Enthesopathy, unspecified: Secondary | ICD-10-CM

## 2020-02-21 DIAGNOSIS — M2140 Flat foot [pes planus] (acquired), unspecified foot: Secondary | ICD-10-CM

## 2020-02-21 NOTE — Progress Notes (Signed)
Subjective: Patricia Lane is a 69 y.o. female patient who presents to office for evaluation of left foot pain.  Patient reports that her left foot hurts but tends to lock up on her sometimes when she steps a certain way her foot feels weak like she is going to fall down.  Patient reports that her elastic brace helps some but she tends to worry because she has increased episodes of falling.  Patient denies any recent injury.  Patient reports that her topical pain cream does help from time to time.  Patient denies any other pedal complaints at this time.  Patient is assisted by husband at this visit.  Fasting blood sugar not recorded last A1c does not recall  Patient Active Problem List   Diagnosis Date Noted  . Diabetes mellitus ([place]) 07/01/2016  . Hypertension 07/01/2016  . Chronic back pain 07/01/2016    Current Outpatient Medications on File Prior to Visit  Medication Sig Dispense Refill  . aspirin 81 MG tablet Take 81 mg by mouth daily.    Marland Kitchen gabapentin (NEURONTIN) 400 MG capsule     . gabapentin (NEURONTIN) 400 MG capsule Take 400 mg by mouth.    . losartan (COZAAR) 25 MG tablet Take 25 mg by mouth.    . metoprolol succinate (TOPROL-XL) 100 MG 24 hr tablet Take 100 mg by mouth daily. Take with or immediately following a meal.    . NOVOLIN N RELION 100 UNIT/ML injection     . omeprazole (PRILOSEC) 20 MG capsule Take 20 mg by mouth daily.    . Red Yeast Rice 600 MG CAPS Take 1,200 mg by mouth.     No current facility-administered medications on file prior to visit.    Allergies  Allergen Reactions  . Codeine Itching and Rash  . Demerol [Meperidine] Itching  . Penicillins Itching and Rash    Objective:  General: Alert and oriented x3 in no acute distress  Dermatology: No open lesions bilateral lower extremities, no webspace macerations, no ecchymosis bilateral, all nails x 10 are short and thickened but well manicured.  Vascular: Dorsalis Pedis and Posterior Tibial pedal pulses  palpable, Capillary Fill Time 3 seconds,(-) pedal hair growth bilateral, trace edema bilateral lower extremities, varicosities noted bilateral, temperature gradient within normal limits.  Neurology: Johney Maine sensation intact via light touch bilateral, Protective sensation diminished with Thornell Mule Monofilament to all pedal sites, vibratory severely diminished bilateral.  Musculoskeletal: Mild tenderness with palpation at posterior tibial tendon course left medial ankle greater than lateral ankle.  On gait exam left foot is severely pronated with heel and severe rear foot valgus patient is literally walking on the medial side of her foot and ankle on the left with impingement noted at the lateral ankle.  X-rays left foot and ankle reveal severe arthritis  Assessment and Plan: Problem List Items Addressed This Visit      Endocrine   Diabetes mellitus ([place])    Other Visit Diagnoses    Capsulitis    -  Primary   Relevant Orders   DG Foot Complete Left   Arthritis of left foot       Pes planus of both feet       Gait instability       History of fall       Acquired equinus deformity of both feet           -Complete examination performed -Xrays reviewed -Discussed treatement options for arthritis with capsulitis with severe pes planus  deformity and instability -Rx ankle gauntlet for patient to use temporarily however she would be a better candidate for a balance brace versus diabetic shoes with custom insole for her severe flatfoot deformity -Advised patient to see Liliane Channel for above -Advised patient to continue with over-the-counter topical pain cream or rub -Advised patient to continue with over-the-counter Tylenol as needed -Advised patient to use cane or walker for stability in gait -Patient to return to office as scheduled or sooner if condition worsens.  Landis Martins, DPM

## 2020-02-23 ENCOUNTER — Other Ambulatory Visit: Payer: Self-pay | Admitting: Sports Medicine

## 2020-02-23 DIAGNOSIS — M2140 Flat foot [pes planus] (acquired), unspecified foot: Secondary | ICD-10-CM

## 2020-02-23 DIAGNOSIS — M1990 Unspecified osteoarthritis, unspecified site: Secondary | ICD-10-CM

## 2020-02-23 DIAGNOSIS — M779 Enthesopathy, unspecified: Secondary | ICD-10-CM

## 2020-03-13 DIAGNOSIS — I6523 Occlusion and stenosis of bilateral carotid arteries: Secondary | ICD-10-CM | POA: Diagnosis not present

## 2020-03-13 DIAGNOSIS — E119 Type 2 diabetes mellitus without complications: Secondary | ICD-10-CM | POA: Diagnosis not present

## 2020-03-13 DIAGNOSIS — R42 Dizziness and giddiness: Secondary | ICD-10-CM | POA: Diagnosis not present

## 2020-03-13 DIAGNOSIS — N390 Urinary tract infection, site not specified: Secondary | ICD-10-CM | POA: Diagnosis not present

## 2020-03-13 DIAGNOSIS — E860 Dehydration: Secondary | ICD-10-CM | POA: Diagnosis not present

## 2020-03-13 DIAGNOSIS — I672 Cerebral atherosclerosis: Secondary | ICD-10-CM | POA: Diagnosis not present

## 2020-03-13 DIAGNOSIS — R202 Paresthesia of skin: Secondary | ICD-10-CM | POA: Diagnosis not present

## 2020-03-13 DIAGNOSIS — R200 Anesthesia of skin: Secondary | ICD-10-CM | POA: Diagnosis not present

## 2020-03-13 DIAGNOSIS — B9689 Other specified bacterial agents as the cause of diseases classified elsewhere: Secondary | ICD-10-CM | POA: Diagnosis not present

## 2020-03-13 DIAGNOSIS — G319 Degenerative disease of nervous system, unspecified: Secondary | ICD-10-CM | POA: Diagnosis not present

## 2020-03-13 DIAGNOSIS — H538 Other visual disturbances: Secondary | ICD-10-CM | POA: Diagnosis not present

## 2020-03-20 DIAGNOSIS — Z90710 Acquired absence of both cervix and uterus: Secondary | ICD-10-CM | POA: Diagnosis not present

## 2020-03-20 DIAGNOSIS — E11319 Type 2 diabetes mellitus with unspecified diabetic retinopathy without macular edema: Secondary | ICD-10-CM | POA: Diagnosis not present

## 2020-03-20 DIAGNOSIS — F5101 Primary insomnia: Secondary | ICD-10-CM | POA: Diagnosis not present

## 2020-03-20 DIAGNOSIS — E7800 Pure hypercholesterolemia, unspecified: Secondary | ICD-10-CM | POA: Diagnosis not present

## 2020-03-20 DIAGNOSIS — Z0000 Encounter for general adult medical examination without abnormal findings: Secondary | ICD-10-CM | POA: Diagnosis not present

## 2020-03-20 DIAGNOSIS — Z6841 Body Mass Index (BMI) 40.0 and over, adult: Secondary | ICD-10-CM | POA: Diagnosis not present

## 2020-03-20 DIAGNOSIS — F341 Dysthymic disorder: Secondary | ICD-10-CM | POA: Diagnosis not present

## 2020-03-20 DIAGNOSIS — K219 Gastro-esophageal reflux disease without esophagitis: Secondary | ICD-10-CM | POA: Diagnosis not present

## 2020-03-26 DIAGNOSIS — E11319 Type 2 diabetes mellitus with unspecified diabetic retinopathy without macular edema: Secondary | ICD-10-CM | POA: Diagnosis not present

## 2020-03-26 DIAGNOSIS — E1169 Type 2 diabetes mellitus with other specified complication: Secondary | ICD-10-CM | POA: Diagnosis not present

## 2020-04-03 ENCOUNTER — Other Ambulatory Visit: Payer: Medicare HMO | Admitting: Orthotics

## 2020-04-05 ENCOUNTER — Other Ambulatory Visit: Payer: Self-pay

## 2020-04-05 ENCOUNTER — Ambulatory Visit: Payer: Medicare HMO | Admitting: Orthotics

## 2020-04-05 DIAGNOSIS — R2681 Unsteadiness on feet: Secondary | ICD-10-CM

## 2020-04-05 DIAGNOSIS — M2141 Flat foot [pes planus] (acquired), right foot: Secondary | ICD-10-CM

## 2020-04-05 DIAGNOSIS — M779 Enthesopathy, unspecified: Secondary | ICD-10-CM

## 2020-04-05 DIAGNOSIS — M19072 Primary osteoarthritis, left ankle and foot: Secondary | ICD-10-CM

## 2020-04-05 DIAGNOSIS — M2142 Flat foot [pes planus] (acquired), left foot: Secondary | ICD-10-CM

## 2020-04-05 NOTE — Progress Notes (Signed)
Patient was shown Az brace, which is her only possible option for ankle inversion/laxity.Marland KitchenMarland KitchenYet she doesn't want to go that route.

## 2020-05-07 DIAGNOSIS — J069 Acute upper respiratory infection, unspecified: Secondary | ICD-10-CM | POA: Diagnosis not present

## 2020-05-09 DIAGNOSIS — J069 Acute upper respiratory infection, unspecified: Secondary | ICD-10-CM | POA: Diagnosis not present

## 2020-05-15 DIAGNOSIS — J22 Unspecified acute lower respiratory infection: Secondary | ICD-10-CM | POA: Diagnosis not present

## 2020-06-03 DIAGNOSIS — E113293 Type 2 diabetes mellitus with mild nonproliferative diabetic retinopathy without macular edema, bilateral: Secondary | ICD-10-CM | POA: Diagnosis not present

## 2020-06-21 DIAGNOSIS — Z6841 Body Mass Index (BMI) 40.0 and over, adult: Secondary | ICD-10-CM | POA: Diagnosis not present

## 2020-06-21 DIAGNOSIS — E113293 Type 2 diabetes mellitus with mild nonproliferative diabetic retinopathy without macular edema, bilateral: Secondary | ICD-10-CM | POA: Diagnosis not present

## 2020-06-21 DIAGNOSIS — G2581 Restless legs syndrome: Secondary | ICD-10-CM | POA: Diagnosis not present

## 2020-06-21 DIAGNOSIS — K219 Gastro-esophageal reflux disease without esophagitis: Secondary | ICD-10-CM | POA: Diagnosis not present

## 2020-06-21 DIAGNOSIS — D519 Vitamin B12 deficiency anemia, unspecified: Secondary | ICD-10-CM | POA: Diagnosis not present

## 2020-06-21 DIAGNOSIS — G250 Essential tremor: Secondary | ICD-10-CM | POA: Diagnosis not present

## 2020-06-21 DIAGNOSIS — F341 Dysthymic disorder: Secondary | ICD-10-CM | POA: Diagnosis not present

## 2020-06-21 DIAGNOSIS — E7800 Pure hypercholesterolemia, unspecified: Secondary | ICD-10-CM | POA: Diagnosis not present

## 2020-07-08 DIAGNOSIS — E119 Type 2 diabetes mellitus without complications: Secondary | ICD-10-CM | POA: Diagnosis not present

## 2020-07-23 DIAGNOSIS — E1142 Type 2 diabetes mellitus with diabetic polyneuropathy: Secondary | ICD-10-CM | POA: Diagnosis not present

## 2020-08-04 DIAGNOSIS — J069 Acute upper respiratory infection, unspecified: Secondary | ICD-10-CM | POA: Diagnosis not present

## 2020-09-19 DIAGNOSIS — E113293 Type 2 diabetes mellitus with mild nonproliferative diabetic retinopathy without macular edema, bilateral: Secondary | ICD-10-CM | POA: Diagnosis not present

## 2020-09-19 DIAGNOSIS — H353133 Nonexudative age-related macular degeneration, bilateral, advanced atrophic without subfoveal involvement: Secondary | ICD-10-CM | POA: Diagnosis not present

## 2020-09-20 DIAGNOSIS — G2581 Restless legs syndrome: Secondary | ICD-10-CM | POA: Diagnosis not present

## 2020-09-20 DIAGNOSIS — F341 Dysthymic disorder: Secondary | ICD-10-CM | POA: Diagnosis not present

## 2020-09-20 DIAGNOSIS — E7800 Pure hypercholesterolemia, unspecified: Secondary | ICD-10-CM | POA: Diagnosis not present

## 2020-09-20 DIAGNOSIS — Z6841 Body Mass Index (BMI) 40.0 and over, adult: Secondary | ICD-10-CM | POA: Diagnosis not present

## 2020-09-20 DIAGNOSIS — G250 Essential tremor: Secondary | ICD-10-CM | POA: Diagnosis not present

## 2020-09-20 DIAGNOSIS — K219 Gastro-esophageal reflux disease without esophagitis: Secondary | ICD-10-CM | POA: Diagnosis not present

## 2020-09-20 DIAGNOSIS — E11319 Type 2 diabetes mellitus with unspecified diabetic retinopathy without macular edema: Secondary | ICD-10-CM | POA: Diagnosis not present

## 2020-09-30 DIAGNOSIS — E119 Type 2 diabetes mellitus without complications: Secondary | ICD-10-CM | POA: Diagnosis not present

## 2020-12-19 DIAGNOSIS — E119 Type 2 diabetes mellitus without complications: Secondary | ICD-10-CM | POA: Diagnosis not present

## 2020-12-23 DIAGNOSIS — F341 Dysthymic disorder: Secondary | ICD-10-CM | POA: Diagnosis not present

## 2020-12-23 DIAGNOSIS — F5101 Primary insomnia: Secondary | ICD-10-CM | POA: Diagnosis not present

## 2020-12-23 DIAGNOSIS — E11319 Type 2 diabetes mellitus with unspecified diabetic retinopathy without macular edema: Secondary | ICD-10-CM | POA: Diagnosis not present

## 2020-12-23 DIAGNOSIS — K219 Gastro-esophageal reflux disease without esophagitis: Secondary | ICD-10-CM | POA: Diagnosis not present

## 2020-12-23 DIAGNOSIS — D519 Vitamin B12 deficiency anemia, unspecified: Secondary | ICD-10-CM | POA: Diagnosis not present

## 2020-12-23 DIAGNOSIS — E7800 Pure hypercholesterolemia, unspecified: Secondary | ICD-10-CM | POA: Diagnosis not present

## 2020-12-23 DIAGNOSIS — Z6841 Body Mass Index (BMI) 40.0 and over, adult: Secondary | ICD-10-CM | POA: Diagnosis not present

## 2020-12-23 DIAGNOSIS — G2581 Restless legs syndrome: Secondary | ICD-10-CM | POA: Diagnosis not present

## 2021-02-04 DIAGNOSIS — Z0000 Encounter for general adult medical examination without abnormal findings: Secondary | ICD-10-CM | POA: Diagnosis not present

## 2021-02-04 DIAGNOSIS — Z23 Encounter for immunization: Secondary | ICD-10-CM | POA: Diagnosis not present

## 2021-02-04 DIAGNOSIS — D519 Vitamin B12 deficiency anemia, unspecified: Secondary | ICD-10-CM | POA: Diagnosis not present

## 2021-02-04 DIAGNOSIS — Z6841 Body Mass Index (BMI) 40.0 and over, adult: Secondary | ICD-10-CM | POA: Diagnosis not present

## 2021-02-04 DIAGNOSIS — K219 Gastro-esophageal reflux disease without esophagitis: Secondary | ICD-10-CM | POA: Diagnosis not present

## 2021-02-04 DIAGNOSIS — G2581 Restless legs syndrome: Secondary | ICD-10-CM | POA: Diagnosis not present

## 2021-02-04 DIAGNOSIS — F341 Dysthymic disorder: Secondary | ICD-10-CM | POA: Diagnosis not present

## 2021-02-04 DIAGNOSIS — E11319 Type 2 diabetes mellitus with unspecified diabetic retinopathy without macular edema: Secondary | ICD-10-CM | POA: Diagnosis not present

## 2021-02-04 DIAGNOSIS — E7800 Pure hypercholesterolemia, unspecified: Secondary | ICD-10-CM | POA: Diagnosis not present

## 2021-02-24 DIAGNOSIS — J069 Acute upper respiratory infection, unspecified: Secondary | ICD-10-CM | POA: Diagnosis not present

## 2021-03-10 DIAGNOSIS — E119 Type 2 diabetes mellitus without complications: Secondary | ICD-10-CM | POA: Diagnosis not present

## 2021-03-27 DIAGNOSIS — H353133 Nonexudative age-related macular degeneration, bilateral, advanced atrophic without subfoveal involvement: Secondary | ICD-10-CM | POA: Diagnosis not present

## 2021-05-29 DIAGNOSIS — K219 Gastro-esophageal reflux disease without esophagitis: Secondary | ICD-10-CM | POA: Diagnosis not present

## 2021-05-29 DIAGNOSIS — E7800 Pure hypercholesterolemia, unspecified: Secondary | ICD-10-CM | POA: Diagnosis not present

## 2021-05-29 DIAGNOSIS — E11319 Type 2 diabetes mellitus with unspecified diabetic retinopathy without macular edema: Secondary | ICD-10-CM | POA: Diagnosis not present

## 2021-05-29 DIAGNOSIS — Z6841 Body Mass Index (BMI) 40.0 and over, adult: Secondary | ICD-10-CM | POA: Diagnosis not present

## 2021-05-29 DIAGNOSIS — Z1231 Encounter for screening mammogram for malignant neoplasm of breast: Secondary | ICD-10-CM | POA: Diagnosis not present

## 2021-05-29 DIAGNOSIS — G2581 Restless legs syndrome: Secondary | ICD-10-CM | POA: Diagnosis not present

## 2021-06-04 DIAGNOSIS — E119 Type 2 diabetes mellitus without complications: Secondary | ICD-10-CM | POA: Diagnosis not present

## 2021-07-25 DIAGNOSIS — E7800 Pure hypercholesterolemia, unspecified: Secondary | ICD-10-CM | POA: Diagnosis not present

## 2021-07-25 DIAGNOSIS — E11319 Type 2 diabetes mellitus with unspecified diabetic retinopathy without macular edema: Secondary | ICD-10-CM | POA: Diagnosis not present

## 2021-07-25 DIAGNOSIS — K219 Gastro-esophageal reflux disease without esophagitis: Secondary | ICD-10-CM | POA: Diagnosis not present

## 2021-07-25 DIAGNOSIS — R350 Frequency of micturition: Secondary | ICD-10-CM | POA: Diagnosis not present

## 2021-08-23 DIAGNOSIS — E119 Type 2 diabetes mellitus without complications: Secondary | ICD-10-CM | POA: Diagnosis not present

## 2021-08-24 DIAGNOSIS — E11319 Type 2 diabetes mellitus with unspecified diabetic retinopathy without macular edema: Secondary | ICD-10-CM | POA: Diagnosis not present

## 2021-08-24 DIAGNOSIS — K219 Gastro-esophageal reflux disease without esophagitis: Secondary | ICD-10-CM | POA: Diagnosis not present

## 2021-08-24 DIAGNOSIS — E7800 Pure hypercholesterolemia, unspecified: Secondary | ICD-10-CM | POA: Diagnosis not present

## 2021-08-26 DIAGNOSIS — K219 Gastro-esophageal reflux disease without esophagitis: Secondary | ICD-10-CM | POA: Diagnosis not present

## 2021-08-26 DIAGNOSIS — F341 Dysthymic disorder: Secondary | ICD-10-CM | POA: Diagnosis not present

## 2021-08-26 DIAGNOSIS — E7800 Pure hypercholesterolemia, unspecified: Secondary | ICD-10-CM | POA: Diagnosis not present

## 2021-08-26 DIAGNOSIS — F5101 Primary insomnia: Secondary | ICD-10-CM | POA: Diagnosis not present

## 2021-08-26 DIAGNOSIS — E11319 Type 2 diabetes mellitus with unspecified diabetic retinopathy without macular edema: Secondary | ICD-10-CM | POA: Diagnosis not present

## 2021-08-26 DIAGNOSIS — Z6841 Body Mass Index (BMI) 40.0 and over, adult: Secondary | ICD-10-CM | POA: Diagnosis not present

## 2021-08-26 DIAGNOSIS — G2581 Restless legs syndrome: Secondary | ICD-10-CM | POA: Diagnosis not present

## 2021-08-26 DIAGNOSIS — G250 Essential tremor: Secondary | ICD-10-CM | POA: Diagnosis not present

## 2021-09-25 DIAGNOSIS — H353134 Nonexudative age-related macular degeneration, bilateral, advanced atrophic with subfoveal involvement: Secondary | ICD-10-CM | POA: Diagnosis not present

## 2021-09-25 DIAGNOSIS — E113293 Type 2 diabetes mellitus with mild nonproliferative diabetic retinopathy without macular edema, bilateral: Secondary | ICD-10-CM | POA: Diagnosis not present

## 2021-10-24 DIAGNOSIS — E7800 Pure hypercholesterolemia, unspecified: Secondary | ICD-10-CM | POA: Diagnosis not present

## 2021-10-24 DIAGNOSIS — E11319 Type 2 diabetes mellitus with unspecified diabetic retinopathy without macular edema: Secondary | ICD-10-CM | POA: Diagnosis not present

## 2021-12-05 DIAGNOSIS — F341 Dysthymic disorder: Secondary | ICD-10-CM | POA: Diagnosis not present

## 2021-12-05 DIAGNOSIS — Z6841 Body Mass Index (BMI) 40.0 and over, adult: Secondary | ICD-10-CM | POA: Diagnosis not present

## 2021-12-05 DIAGNOSIS — G2581 Restless legs syndrome: Secondary | ICD-10-CM | POA: Diagnosis not present

## 2021-12-05 DIAGNOSIS — K219 Gastro-esophageal reflux disease without esophagitis: Secondary | ICD-10-CM | POA: Diagnosis not present

## 2021-12-05 DIAGNOSIS — E7800 Pure hypercholesterolemia, unspecified: Secondary | ICD-10-CM | POA: Diagnosis not present

## 2021-12-05 DIAGNOSIS — E11319 Type 2 diabetes mellitus with unspecified diabetic retinopathy without macular edema: Secondary | ICD-10-CM | POA: Diagnosis not present

## 2021-12-19 DIAGNOSIS — Z1231 Encounter for screening mammogram for malignant neoplasm of breast: Secondary | ICD-10-CM | POA: Diagnosis not present

## 2021-12-30 DIAGNOSIS — R197 Diarrhea, unspecified: Secondary | ICD-10-CM | POA: Diagnosis not present

## 2022-01-15 DIAGNOSIS — R5383 Other fatigue: Secondary | ICD-10-CM | POA: Diagnosis not present

## 2022-01-15 DIAGNOSIS — Z6837 Body mass index (BMI) 37.0-37.9, adult: Secondary | ICD-10-CM | POA: Diagnosis not present

## 2022-01-15 DIAGNOSIS — E7800 Pure hypercholesterolemia, unspecified: Secondary | ICD-10-CM | POA: Diagnosis not present

## 2022-01-15 DIAGNOSIS — R112 Nausea with vomiting, unspecified: Secondary | ICD-10-CM | POA: Diagnosis not present

## 2022-01-15 DIAGNOSIS — R1013 Epigastric pain: Secondary | ICD-10-CM | POA: Diagnosis not present

## 2022-01-15 DIAGNOSIS — E1169 Type 2 diabetes mellitus with other specified complication: Secondary | ICD-10-CM | POA: Diagnosis not present

## 2022-01-15 DIAGNOSIS — I10 Essential (primary) hypertension: Secondary | ICD-10-CM | POA: Diagnosis not present

## 2022-01-15 DIAGNOSIS — E785 Hyperlipidemia, unspecified: Secondary | ICD-10-CM | POA: Diagnosis not present

## 2022-01-22 DIAGNOSIS — Z6838 Body mass index (BMI) 38.0-38.9, adult: Secondary | ICD-10-CM | POA: Diagnosis not present

## 2022-01-22 DIAGNOSIS — E7800 Pure hypercholesterolemia, unspecified: Secondary | ICD-10-CM | POA: Diagnosis not present

## 2022-01-22 DIAGNOSIS — R42 Dizziness and giddiness: Secondary | ICD-10-CM | POA: Diagnosis not present

## 2022-01-22 DIAGNOSIS — N1831 Chronic kidney disease, stage 3a: Secondary | ICD-10-CM | POA: Diagnosis not present

## 2022-01-22 DIAGNOSIS — E785 Hyperlipidemia, unspecified: Secondary | ICD-10-CM | POA: Diagnosis not present

## 2022-01-22 DIAGNOSIS — R112 Nausea with vomiting, unspecified: Secondary | ICD-10-CM | POA: Diagnosis not present

## 2022-01-22 DIAGNOSIS — I10 Essential (primary) hypertension: Secondary | ICD-10-CM | POA: Diagnosis not present

## 2022-01-22 DIAGNOSIS — E1169 Type 2 diabetes mellitus with other specified complication: Secondary | ICD-10-CM | POA: Diagnosis not present

## 2022-01-22 DIAGNOSIS — R1013 Epigastric pain: Secondary | ICD-10-CM | POA: Diagnosis not present

## 2022-01-24 DIAGNOSIS — K219 Gastro-esophageal reflux disease without esophagitis: Secondary | ICD-10-CM | POA: Diagnosis not present

## 2022-01-24 DIAGNOSIS — E7800 Pure hypercholesterolemia, unspecified: Secondary | ICD-10-CM | POA: Diagnosis not present

## 2022-01-24 DIAGNOSIS — E113293 Type 2 diabetes mellitus with mild nonproliferative diabetic retinopathy without macular edema, bilateral: Secondary | ICD-10-CM | POA: Diagnosis not present

## 2022-01-24 DIAGNOSIS — E1142 Type 2 diabetes mellitus with diabetic polyneuropathy: Secondary | ICD-10-CM | POA: Diagnosis not present

## 2022-02-05 DIAGNOSIS — E7800 Pure hypercholesterolemia, unspecified: Secondary | ICD-10-CM | POA: Diagnosis not present

## 2022-02-05 DIAGNOSIS — E785 Hyperlipidemia, unspecified: Secondary | ICD-10-CM | POA: Diagnosis not present

## 2022-02-05 DIAGNOSIS — N1831 Chronic kidney disease, stage 3a: Secondary | ICD-10-CM | POA: Diagnosis not present

## 2022-02-05 DIAGNOSIS — R1013 Epigastric pain: Secondary | ICD-10-CM | POA: Diagnosis not present

## 2022-02-05 DIAGNOSIS — E1169 Type 2 diabetes mellitus with other specified complication: Secondary | ICD-10-CM | POA: Diagnosis not present

## 2022-02-05 DIAGNOSIS — I10 Essential (primary) hypertension: Secondary | ICD-10-CM | POA: Diagnosis not present

## 2022-02-05 DIAGNOSIS — R42 Dizziness and giddiness: Secondary | ICD-10-CM | POA: Diagnosis not present

## 2022-02-05 DIAGNOSIS — R112 Nausea with vomiting, unspecified: Secondary | ICD-10-CM | POA: Diagnosis not present

## 2022-02-05 DIAGNOSIS — Z6838 Body mass index (BMI) 38.0-38.9, adult: Secondary | ICD-10-CM | POA: Diagnosis not present

## 2022-02-06 DIAGNOSIS — Z0000 Encounter for general adult medical examination without abnormal findings: Secondary | ICD-10-CM | POA: Diagnosis not present

## 2022-02-06 DIAGNOSIS — E7800 Pure hypercholesterolemia, unspecified: Secondary | ICD-10-CM | POA: Diagnosis not present

## 2022-02-06 DIAGNOSIS — I10 Essential (primary) hypertension: Secondary | ICD-10-CM | POA: Diagnosis not present

## 2022-02-06 DIAGNOSIS — F341 Dysthymic disorder: Secondary | ICD-10-CM | POA: Diagnosis not present

## 2022-02-06 DIAGNOSIS — Z90710 Acquired absence of both cervix and uterus: Secondary | ICD-10-CM | POA: Diagnosis not present

## 2022-02-06 DIAGNOSIS — E11319 Type 2 diabetes mellitus with unspecified diabetic retinopathy without macular edema: Secondary | ICD-10-CM | POA: Diagnosis not present

## 2022-02-06 DIAGNOSIS — Z23 Encounter for immunization: Secondary | ICD-10-CM | POA: Diagnosis not present

## 2022-02-06 DIAGNOSIS — K219 Gastro-esophageal reflux disease without esophagitis: Secondary | ICD-10-CM | POA: Diagnosis not present

## 2022-02-06 DIAGNOSIS — D519 Vitamin B12 deficiency anemia, unspecified: Secondary | ICD-10-CM | POA: Diagnosis not present

## 2022-02-20 DIAGNOSIS — N1831 Chronic kidney disease, stage 3a: Secondary | ICD-10-CM | POA: Diagnosis not present

## 2022-02-20 DIAGNOSIS — R42 Dizziness and giddiness: Secondary | ICD-10-CM | POA: Diagnosis not present

## 2022-02-20 DIAGNOSIS — E1169 Type 2 diabetes mellitus with other specified complication: Secondary | ICD-10-CM | POA: Diagnosis not present

## 2022-02-20 DIAGNOSIS — R112 Nausea with vomiting, unspecified: Secondary | ICD-10-CM | POA: Diagnosis not present

## 2022-02-20 DIAGNOSIS — E785 Hyperlipidemia, unspecified: Secondary | ICD-10-CM | POA: Diagnosis not present

## 2022-02-20 DIAGNOSIS — M159 Polyosteoarthritis, unspecified: Secondary | ICD-10-CM | POA: Diagnosis not present

## 2022-02-20 DIAGNOSIS — I10 Essential (primary) hypertension: Secondary | ICD-10-CM | POA: Diagnosis not present

## 2022-02-20 DIAGNOSIS — R1013 Epigastric pain: Secondary | ICD-10-CM | POA: Diagnosis not present

## 2022-02-20 DIAGNOSIS — E7800 Pure hypercholesterolemia, unspecified: Secondary | ICD-10-CM | POA: Diagnosis not present

## 2022-02-27 DIAGNOSIS — E1169 Type 2 diabetes mellitus with other specified complication: Secondary | ICD-10-CM | POA: Diagnosis not present

## 2022-02-27 DIAGNOSIS — R112 Nausea with vomiting, unspecified: Secondary | ICD-10-CM | POA: Diagnosis not present

## 2022-02-27 DIAGNOSIS — M159 Polyosteoarthritis, unspecified: Secondary | ICD-10-CM | POA: Diagnosis not present

## 2022-02-27 DIAGNOSIS — E785 Hyperlipidemia, unspecified: Secondary | ICD-10-CM | POA: Diagnosis not present

## 2022-02-27 DIAGNOSIS — I10 Essential (primary) hypertension: Secondary | ICD-10-CM | POA: Diagnosis not present

## 2022-02-27 DIAGNOSIS — R1013 Epigastric pain: Secondary | ICD-10-CM | POA: Diagnosis not present

## 2022-02-27 DIAGNOSIS — R42 Dizziness and giddiness: Secondary | ICD-10-CM | POA: Diagnosis not present

## 2022-02-27 DIAGNOSIS — J0100 Acute maxillary sinusitis, unspecified: Secondary | ICD-10-CM | POA: Diagnosis not present

## 2022-02-27 DIAGNOSIS — N1831 Chronic kidney disease, stage 3a: Secondary | ICD-10-CM | POA: Diagnosis not present

## 2022-03-25 DIAGNOSIS — M159 Polyosteoarthritis, unspecified: Secondary | ICD-10-CM | POA: Diagnosis not present

## 2022-03-25 DIAGNOSIS — E1169 Type 2 diabetes mellitus with other specified complication: Secondary | ICD-10-CM | POA: Diagnosis not present

## 2022-03-25 DIAGNOSIS — N1831 Chronic kidney disease, stage 3a: Secondary | ICD-10-CM | POA: Diagnosis not present

## 2022-03-25 DIAGNOSIS — M810 Age-related osteoporosis without current pathological fracture: Secondary | ICD-10-CM | POA: Diagnosis not present

## 2022-03-25 DIAGNOSIS — I10 Essential (primary) hypertension: Secondary | ICD-10-CM | POA: Diagnosis not present

## 2022-03-25 DIAGNOSIS — R1013 Epigastric pain: Secondary | ICD-10-CM | POA: Diagnosis not present

## 2022-03-25 DIAGNOSIS — E785 Hyperlipidemia, unspecified: Secondary | ICD-10-CM | POA: Diagnosis not present

## 2022-03-25 DIAGNOSIS — R42 Dizziness and giddiness: Secondary | ICD-10-CM | POA: Diagnosis not present

## 2022-03-25 DIAGNOSIS — R112 Nausea with vomiting, unspecified: Secondary | ICD-10-CM | POA: Diagnosis not present

## 2022-04-02 DIAGNOSIS — H353134 Nonexudative age-related macular degeneration, bilateral, advanced atrophic with subfoveal involvement: Secondary | ICD-10-CM | POA: Diagnosis not present

## 2022-04-02 DIAGNOSIS — E113293 Type 2 diabetes mellitus with mild nonproliferative diabetic retinopathy without macular edema, bilateral: Secondary | ICD-10-CM | POA: Diagnosis not present

## 2022-04-08 DIAGNOSIS — R1013 Epigastric pain: Secondary | ICD-10-CM | POA: Diagnosis not present

## 2022-04-08 DIAGNOSIS — R112 Nausea with vomiting, unspecified: Secondary | ICD-10-CM | POA: Diagnosis not present

## 2022-04-08 DIAGNOSIS — N1831 Chronic kidney disease, stage 3a: Secondary | ICD-10-CM | POA: Diagnosis not present

## 2022-04-08 DIAGNOSIS — E1169 Type 2 diabetes mellitus with other specified complication: Secondary | ICD-10-CM | POA: Diagnosis not present

## 2022-04-08 DIAGNOSIS — E785 Hyperlipidemia, unspecified: Secondary | ICD-10-CM | POA: Diagnosis not present

## 2022-04-08 DIAGNOSIS — E1140 Type 2 diabetes mellitus with diabetic neuropathy, unspecified: Secondary | ICD-10-CM | POA: Diagnosis not present

## 2022-04-08 DIAGNOSIS — M810 Age-related osteoporosis without current pathological fracture: Secondary | ICD-10-CM | POA: Diagnosis not present

## 2022-04-08 DIAGNOSIS — R42 Dizziness and giddiness: Secondary | ICD-10-CM | POA: Diagnosis not present

## 2022-04-08 DIAGNOSIS — M159 Polyosteoarthritis, unspecified: Secondary | ICD-10-CM | POA: Diagnosis not present

## 2022-04-16 DIAGNOSIS — H353114 Nonexudative age-related macular degeneration, right eye, advanced atrophic with subfoveal involvement: Secondary | ICD-10-CM | POA: Diagnosis not present

## 2022-04-22 DIAGNOSIS — M159 Polyosteoarthritis, unspecified: Secondary | ICD-10-CM | POA: Diagnosis not present

## 2022-04-22 DIAGNOSIS — E1140 Type 2 diabetes mellitus with diabetic neuropathy, unspecified: Secondary | ICD-10-CM | POA: Diagnosis not present

## 2022-04-22 DIAGNOSIS — I10 Essential (primary) hypertension: Secondary | ICD-10-CM | POA: Diagnosis not present

## 2022-04-22 DIAGNOSIS — N1831 Chronic kidney disease, stage 3a: Secondary | ICD-10-CM | POA: Diagnosis not present

## 2022-04-22 DIAGNOSIS — E785 Hyperlipidemia, unspecified: Secondary | ICD-10-CM | POA: Diagnosis not present

## 2022-04-22 DIAGNOSIS — R112 Nausea with vomiting, unspecified: Secondary | ICD-10-CM | POA: Diagnosis not present

## 2022-04-22 DIAGNOSIS — R42 Dizziness and giddiness: Secondary | ICD-10-CM | POA: Diagnosis not present

## 2022-04-22 DIAGNOSIS — R1013 Epigastric pain: Secondary | ICD-10-CM | POA: Diagnosis not present

## 2022-04-22 DIAGNOSIS — E1169 Type 2 diabetes mellitus with other specified complication: Secondary | ICD-10-CM | POA: Diagnosis not present

## 2022-05-20 DIAGNOSIS — M159 Polyosteoarthritis, unspecified: Secondary | ICD-10-CM | POA: Diagnosis not present

## 2022-05-20 DIAGNOSIS — M810 Age-related osteoporosis without current pathological fracture: Secondary | ICD-10-CM | POA: Diagnosis not present

## 2022-05-20 DIAGNOSIS — N1831 Chronic kidney disease, stage 3a: Secondary | ICD-10-CM | POA: Diagnosis not present

## 2022-05-20 DIAGNOSIS — R1013 Epigastric pain: Secondary | ICD-10-CM | POA: Diagnosis not present

## 2022-05-20 DIAGNOSIS — I10 Essential (primary) hypertension: Secondary | ICD-10-CM | POA: Diagnosis not present

## 2022-05-20 DIAGNOSIS — E1140 Type 2 diabetes mellitus with diabetic neuropathy, unspecified: Secondary | ICD-10-CM | POA: Diagnosis not present

## 2022-05-20 DIAGNOSIS — E785 Hyperlipidemia, unspecified: Secondary | ICD-10-CM | POA: Diagnosis not present

## 2022-05-20 DIAGNOSIS — R42 Dizziness and giddiness: Secondary | ICD-10-CM | POA: Diagnosis not present

## 2022-05-20 DIAGNOSIS — E1169 Type 2 diabetes mellitus with other specified complication: Secondary | ICD-10-CM | POA: Diagnosis not present

## 2022-12-18 ENCOUNTER — Other Ambulatory Visit: Payer: Self-pay | Admitting: Rehabilitation

## 2022-12-18 DIAGNOSIS — M5451 Vertebrogenic low back pain: Secondary | ICD-10-CM

## 2022-12-18 DIAGNOSIS — M47816 Spondylosis without myelopathy or radiculopathy, lumbar region: Secondary | ICD-10-CM

## 2023-01-02 ENCOUNTER — Ambulatory Visit
Admission: RE | Admit: 2023-01-02 | Discharge: 2023-01-02 | Disposition: A | Discharge status: Home / Self Care | Payer: Medicare HMO | Source: Ambulatory Visit | Attending: Rehabilitation | Admitting: Rehabilitation

## 2023-01-02 DIAGNOSIS — M5451 Vertebrogenic low back pain: Secondary | ICD-10-CM

## 2023-01-02 DIAGNOSIS — M47816 Spondylosis without myelopathy or radiculopathy, lumbar region: Secondary | ICD-10-CM

## 2023-03-09 ENCOUNTER — Encounter (INDEPENDENT_AMBULATORY_CARE_PROVIDER_SITE_OTHER): Payer: Medicare HMO | Admitting: Ophthalmology

## 2023-03-09 DIAGNOSIS — H3581 Retinal edema: Secondary | ICD-10-CM

## 2023-03-16 ENCOUNTER — Encounter (INDEPENDENT_AMBULATORY_CARE_PROVIDER_SITE_OTHER): Payer: Medicare HMO | Admitting: Ophthalmology

## 2023-03-16 DIAGNOSIS — H3581 Retinal edema: Secondary | ICD-10-CM

## 2023-03-16 NOTE — Progress Notes (Shared)
[facility] - Clinic Note  03/30/2023   CHIEF COMPLAINT Patient presents for No chief complaint on file.  HISTORY OF PRESENT ILLNESS: Patricia Lane is a 72 y.o. female who presents to the clinic today for:   Referring physician: Alinda Deem, MD [address] [place],  [place] [address]  HISTORICAL INFORMATION:  Selected notes from the MEDICAL RECORD NUMBER Referred by Dr. Nedra Hai:  Ocular Hx- PMH-   CURRENT MEDICATIONS: No current outpatient medications on file. (Ophthalmic Drugs)   No current facility-administered medications for this visit. (Ophthalmic Drugs)   Current Outpatient Medications (Other)  Medication Sig   aspirin 81 MG tablet Take 81 mg by mouth daily.   atorvastatin (LIPITOR) 10 MG tablet    baclofen (LIORESAL) 10 MG tablet    Blood Glucose Calibration (TRUE METRIX LEVEL 1) Low SOLN    Blood Glucose Monitoring Suppl (TRUE METRIX METER) w/Device KIT    doxepin (SINEQUAN) 10 MG capsule    gabapentin (NEURONTIN) 400 MG capsule    gabapentin (NEURONTIN) 400 MG capsule Take 400 mg by mouth.   hydrOXYzine (ATARAX/VISTARIL) 25 MG tablet    losartan (COZAAR) 25 MG tablet Take 25 mg by mouth.   meloxicam (MOBIC) 15 MG tablet    metoprolol succinate (TOPROL-XL) 100 MG 24 hr tablet Take 100 mg by mouth daily. Take with or immediately following a meal.   NOVOLIN N RELION 100 UNIT/ML injection    omeprazole (PRILOSEC) 20 MG capsule Take 20 mg by mouth daily.   Red Yeast Rice 600 MG CAPS Take 1,200 mg by mouth.   rOPINIRole (REQUIP) 2 MG tablet    solifenacin (VESICARE) 5 MG tablet    SYNJARDY XR 25-1000 MG TB24    TRUE METRIX BLOOD GLUCOSE TEST test strip    TRUEplus Lancets 33G MISC    No current facility-administered medications for this visit. (Other)   REVIEW OF SYSTEMS:  ALLERGIES Allergies  Allergen Reactions   Codeine Itching and Rash   Demerol [Meperidine] Itching   Penicillins Itching and Rash   PAST MEDICAL  HISTORY Past Medical History:  Diagnosis Date   Depression    Diabetes mellitus without complication (HCC)    Tremors of nervous system    Past Surgical History:  Procedure Laterality Date   FOOT SURGERY     GALLBLADDER SURGERY     hysterectomy surgery     NASAL SINUS SURGERY     FAMILY HISTORY Family History  Problem Relation Age of Onset   Liver disease Father    SOCIAL HISTORY Social History   Tobacco Use   Smoking status: Never   Smokeless tobacco: Never       OPHTHALMIC EXAM:  Not recorded    IMAGING AND PROCEDURES  Imaging and Procedures for 03/30/2023        ASSESSMENT/PLAN: No diagnosis found. 1.  2.  3.  Ophthalmic Meds Ordered this visit:  No orders of the defined types were placed in this encounter.    No follow-ups on file.  There are no Patient Instructions on file for this visit.  Explained the diagnoses, plan, and follow up with the patient and they expressed understanding.  Patient expressed understanding of the importance of proper follow up care.   This document serves as a record of services personally performed by Karie Chimera, MD, PhD. It was created on their behalf by Charlette Caffey, COT an ophthalmic technician. The creation of this record is the  provider's dictation and/or activities during the visit.    Electronically signed by:  Charlette Caffey, COT  03/16/23 12:20 PM   Karie Chimera, M.D., Ph.D. Diseases & Surgery of the Retina and Vitreous [facility] 03/30/2023  Abbreviations: M myopia (nearsighted); A astigmatism; H hyperopia (farsighted); P presbyopia; Mrx spectacle prescription;  CTL contact lenses; OD right eye; OS left eye; OU both eyes  XT exotropia; ET esotropia; PEK punctate epithelial keratitis; PEE punctate epithelial erosions; DES dry eye syndrome; MGD meibomian gland dysfunction; ATs artificial tears; PFAT's preservative free artificial tears; NSC nuclear sclerotic cataract;  PSC posterior subcapsular cataract; ERM epi-retinal membrane; PVD posterior vitreous detachment; RD retinal detachment; DM diabetes mellitus; DR diabetic retinopathy; NPDR non-proliferative diabetic retinopathy; PDR proliferative diabetic retinopathy; CSME clinically significant macular edema; DME diabetic macular edema; dbh dot blot hemorrhages; CWS cotton wool spot; POAG primary open angle glaucoma; C/D cup-to-disc ratio; HVF humphrey visual field; GVF goldmann visual field; OCT optical coherence tomography; IOP intraocular pressure; BRVO Branch retinal vein occlusion; CRVO central retinal vein occlusion; CRAO central retinal artery occlusion; BRAO branch retinal artery occlusion; RT retinal tear; SB scleral buckle; PPV pars plana vitrectomy; VH Vitreous hemorrhage; PRP panretinal laser photocoagulation; IVK intravitreal kenalog; VMT vitreomacular traction; MH Macular hole;  NVD neovascularization of the disc; NVE neovascularization elsewhere; AREDS age related eye disease study; ARMD age related macular degeneration; POAG primary open angle glaucoma; EBMD epithelial/anterior basement membrane dystrophy; ACIOL anterior chamber intraocular lens; IOL intraocular lens; PCIOL posterior chamber intraocular lens; Phaco/IOL phacoemulsification with intraocular lens placement; PRK photorefractive keratectomy; LASIK laser assisted in situ keratomileusis; HTN hypertension; DM diabetes mellitus; COPD chronic obstructive pulmonary disease

## 2023-03-30 ENCOUNTER — Encounter (INDEPENDENT_AMBULATORY_CARE_PROVIDER_SITE_OTHER): Payer: Self-pay

## 2023-03-30 ENCOUNTER — Encounter (INDEPENDENT_AMBULATORY_CARE_PROVIDER_SITE_OTHER): Payer: Medicare HMO | Admitting: Ophthalmology

## 2023-03-30 DIAGNOSIS — H3581 Retinal edema: Secondary | ICD-10-CM

## 2023-03-30 NOTE — Progress Notes (Signed)
[facility] - Clinic Note  04/13/2023   CHIEF COMPLAINT Patient presents for Retina Evaluation  HISTORY OF PRESENT ILLNESS: Patricia Lane is a 72 y.o. female who presents to the clinic today for:  HPI     Retina Evaluation   In both eyes.  Duration of 1 year.  Associated Symptoms Floaters and Fatigue.  Negative for Flashes, Distortion, Blind Spot, Pain, Redness, Photophobia, Glare, Trauma, Scalp Tenderness, Jaw Claudication, Shoulder/Hip pain, Fever and Weight Loss.  Treatments tried include injection.  I, the attending physician,  performed the HPI with the patient and updated documentation appropriately.        Comments   Retina eval per Dr.Van. Patient stop going to [facility] in [place]. Patient saw Dr.Pecen there. Patient having injections in both eyes. Patient had 5 injections in the right and 1 injection in the left. Last injection was around March or April this year. Dr. Zenaida Niece did a yag on od.  Patient states she has dry armd od wet armd os      Last edited by Rennis Chris, MD on 04/13/2023 11:58 PM.    Pt is here on the referral of Dr. Zenaida Niece for second opinion of ARMD OU, pt used to see Dr. Jenene Slicker at [facility] and was getting injections in both eyes, she said she got 5 injections and in her right eye and 1 injection in her left eye, pt states she went to [facility] one time and was told they can't do anything for her, pt was also seen at a low vision clinic in [place] and got glasses, which she says do help her, she has not had any injections since April/May, Dr. Zenaida Niece did a Yag on her right eye   Referring physician: Diona Foley, MD [address] [place],  [place] [address]  HISTORICAL INFORMATION:  Selected notes from the MEDICAL RECORD NUMBER Referred by Dr. Zenaida Niece for concern of ARMD OU LEE:  Ocular Hx- PMH-   CURRENT MEDICATIONS: No current outpatient medications on file. (Ophthalmic Drugs)   No current facility-administered medications for this visit.  (Ophthalmic Drugs)   Current Outpatient Medications (Other)  Medication Sig   aspirin 81 MG tablet Take 81 mg by mouth daily.   atorvastatin (LIPITOR) 10 MG tablet    baclofen (LIORESAL) 10 MG tablet    Blood Glucose Calibration (TRUE METRIX LEVEL 1) Low SOLN    Blood Glucose Monitoring Suppl (TRUE METRIX METER) w/Device KIT    doxepin (SINEQUAN) 10 MG capsule    gabapentin (NEURONTIN) 400 MG capsule    gabapentin (NEURONTIN) 400 MG capsule Take 400 mg by mouth.   hydrOXYzine (ATARAX/VISTARIL) 25 MG tablet    losartan (COZAAR) 25 MG tablet Take 25 mg by mouth.   meloxicam (MOBIC) 15 MG tablet    metoprolol succinate (TOPROL-XL) 100 MG 24 hr tablet Take 100 mg by mouth daily. Take with or immediately following a meal.   NOVOLIN N RELION 100 UNIT/ML injection    omeprazole (PRILOSEC) 20 MG capsule Take 20 mg by mouth daily.   Red Yeast Rice 600 MG CAPS Take 1,200 mg by mouth.   rOPINIRole (REQUIP) 2 MG tablet    solifenacin (VESICARE) 5 MG tablet    SYNJARDY XR 25-1000 MG TB24    TRUE METRIX BLOOD GLUCOSE TEST test strip    TRUEplus Lancets 33G MISC    No current facility-administered medications for this visit. (Other)   REVIEW OF SYSTEMS: ROS   Positive for: Endocrine,  Eyes, Psychiatric Negative for: Constitutional, Gastrointestinal, Neurological, Skin, Genitourinary, Musculoskeletal, HENT, Cardiovascular, Respiratory, Allergic/Imm, Heme/Lymph Last edited by Lana Fish, COT on 04/13/2023  1:01 PM.     ALLERGIES Allergies  Allergen Reactions   Codeine Itching and Rash   Demerol [Meperidine] Itching   Penicillins Itching and Rash   PAST MEDICAL HISTORY Past Medical History:  Diagnosis Date   Depression    Diabetes mellitus without complication (HCC)    Hypertension    Psychiatric disorder    Tremors of nervous system    Past Surgical History:  Procedure Laterality Date   CATARACT EXTRACTION     FOOT SURGERY     GALLBLADDER SURGERY     hysterectomy surgery      NASAL SINUS SURGERY     YAG LASER APPLICATION     FAMILY HISTORY Family History  Problem Relation Age of Onset   Liver disease Father    SOCIAL HISTORY Social History   Tobacco Use   Smoking status: Never   Smokeless tobacco: Never       OPHTHALMIC EXAM:  Base Eye Exam     Visual Acuity (Snellen - Linear)       Right Left   Dist cc CF@2 ' 20/40 +1   Dist ph cc NI NI    Correction: Glasses         Tonometry (Tonopen, 1:23 PM)       Right Left   Pressure 13 13         Pupils       Dark Light Shape React APD   Right 4 3 Round Brisk None   Left 4 3 Round Brisk None         Visual Fields (Counting fingers)       Left Right   Restrictions Partial inner superior temporal, inferior temporal, superior nasal, inferior nasal deficiencies Partial inner superior temporal, inferior temporal, superior nasal, inferior nasal deficiencies         Extraocular Movement       Right Left    Full, Ortho Full, Ortho         Neuro/Psych     Oriented x3: Yes   Mood/Affect: Normal         Dilation     Both eyes: 1.0% Mydriacyl, 2.5% Phenylephrine @ 1:22 PM           Slit Lamp and Fundus Exam     Slit Lamp Exam       Right Left   Lids/Lashes Dermatochalasis - upper lid Dermatochalasis - upper lid   Conjunctiva/Sclera nasal and temporal pinguecula nasal and temporal pinguecula   Cornea 1+ inferior Punctate epithelial erosions, well healed cataract wound well healed cataract wound, mild tear film debris, trace PEE   Anterior Chamber deep and clear deep and clear   Iris Round and dilated Round and dilated   Lens PC IOL in good position with open PC PC IOL in good position with open PC   Anterior Vitreous mild syneresis mild syneresis         Fundus Exam       Right Left   Disc mild Pallor, Sharp rim, tilted, temporal PPA mild Pallor, Sharp rim, tilted, 360 PPA   C/D Ratio 0.3 0.3   Macula Flat, Blunted foveal reflex, central GA with pigment  clumping, No heme or edema Flat, Blunted foveal reflex, central GA with central sparring and pigment clumping, punctate IRH ST mac, no edema   Vessels attenuated, mild  tortuosity attenuated, mild tortuosity   Periphery Attached, No heme, 360 paving stone degeneration, focal peripheral cystoid degeneration temporal periphery Attached, 360 paving stone and pigmented CR scarring           Refraction     Manifest Refraction (Auto)       Sphere Cylinder Axis Dist VA   Right -0.50 +1.00 180 CF   Left [place] +1.00 015 20/40           IMAGING AND PROCEDURES  Imaging and Procedures for 04/13/2023  OCT, Retina - OU - Both Eyes       Right Eye Quality was good. Central Foveal Thickness: 247. Progression has no prior data. Findings include no IRF, no SRF, abnormal foveal contour, myopic contour, retinal drusen , outer retinal atrophy.   Left Eye Quality was good. Central Foveal Thickness: 264. Progression has no prior data. Findings include no IRF, no SRF, abnormal foveal contour, myopic contour, retinal drusen , epiretinal membrane, outer retinal atrophy.   Notes *Images captured and stored on drive  Diagnosis / Impression:  Non-exu ARMD with central GA OU No IRF/SRF OU No DME OU  Clinical management:  See below  Abbreviations: NFP - Normal foveal profile. CME - cystoid macular edema. PED - pigment epithelial detachment. IRF - intraretinal fluid. SRF - subretinal fluid. EZ - ellipsoid zone. ERM - epiretinal membrane. ORA - outer retinal atrophy. ORT - outer retinal tubulation. SRHM - subretinal hyper-reflective material. IRHM - intraretinal hyper-reflective material           ASSESSMENT/PLAN:   ICD-10-CM   1. 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 Diabetes mellitus type 2 without  retinopathy (HCC)  E11.9     4. Current use of insulin (HCC)  Z79.4     5. Long term (current) use of oral hypoglycemic drugs  Z79.84     6. Essential hypertension  I10     7. Hypertensive retinopathy of both eyes  H35.033     8. Pseudophakia, both eyes  Z96.1      Age related macular degeneration, non-exudative, both eyes - ?hx of exudative disease OS  - hx of Syfovre injections with Dr. Jenene Slicker at [facility] -- last visit in April 2024  - pt has also been seen by Dr. Vincente Poli at [facility].  - OCT shows central atrophy OU -- OD with fovea spared  - BCVA OD CF @2 ', OS 20/40  - The incidence, anatomy, and pathology of dry AMD, risk of progression, and the AREDS and AREDS 2 study including smoking risks discussed with patient.  - Recommend amsler grid monitoring  - no injections recommended at this time  - f/u 3 months, sooner prn DFE, OCT  2. Myopic degeneration OU  - OCT shows myopic contour OU  - may be contributing to central atrophy  3-5. Diabetes mellitus, type 2 without retinopathy - The incidence, risk factors for progression, natural history and treatment options for diabetic retinopathy  were discussed with patient.   - The need for close monitoring of blood glucose, blood pressure, and serum lipids, avoiding cigarette or any type of tobacco, and the need for long term follow up was also discussed with patient. - f/u in 1 year, sooner prn  6,7. Hypertensive retinopathy OU - discussed importance of  tight BP control - monitor  8. Pseudophakia OU  - s/p CE/IOL OU  - IOLs in good position, doing well  - monitor  Ophthalmic Meds Ordered this visit:  No orders of the defined types were placed in this encounter.    Return in about 3 months (around 07/12/2023) for non ex ARMD OU, Dilated Exam, OCT.  There are no Patient Instructions on file for this visit.  Explained the diagnoses, plan, and follow up with the patient and they expressed understanding.  Patient  expressed understanding of the importance of proper follow up care.   This document serves as a record of services personally performed by Karie Chimera, MD, PhD. It was created on their behalf by Charlette Caffey, COT an ophthalmic technician. The creation of this record is the provider's dictation and/or activities during the visit.    Electronically signed by:  Charlette Caffey, COT  04/14/23 12:05 AM  This document serves as a record of services personally performed by Karie Chimera, MD, PhD. It was created on their behalf by Glee Arvin. Manson Passey, OA an ophthalmic technician. The creation of this record is the provider's dictation and/or activities during the visit.    Electronically signed by: Glee Arvin. Manson Passey, OA 04/14/23 12:05 AM  Karie Chimera, M.D., Ph.D. Diseases & Surgery of the Retina and Vitreous [facility] 04/13/2023  I have reviewed the above documentation for accuracy and completeness, and I agree with the above. Karie Chimera, M.D., Ph.D. 04/14/23 12:05 AM   Abbreviations: M myopia (nearsighted); A astigmatism; H hyperopia (farsighted); P presbyopia; Mrx spectacle prescription;  CTL contact lenses; OD right eye; OS left eye; OU both eyes  XT exotropia; ET esotropia; PEK punctate epithelial keratitis; PEE punctate epithelial erosions; DES dry eye syndrome; MGD meibomian gland dysfunction; ATs artificial tears; PFAT's preservative free artificial tears; NSC nuclear sclerotic cataract; PSC posterior subcapsular cataract; ERM epi-retinal membrane; PVD posterior vitreous detachment; RD retinal detachment; DM diabetes mellitus; DR diabetic retinopathy; NPDR non-proliferative diabetic retinopathy; PDR proliferative diabetic retinopathy; CSME clinically significant macular edema; DME diabetic macular edema; dbh dot blot hemorrhages; CWS cotton wool spot; POAG primary open angle glaucoma; C/D cup-to-disc ratio; HVF humphrey visual field; GVF goldmann visual  field; OCT optical coherence tomography; IOP intraocular pressure; BRVO Branch retinal vein occlusion; CRVO central retinal vein occlusion; CRAO central retinal artery occlusion; BRAO branch retinal artery occlusion; RT retinal tear; SB scleral buckle; PPV pars plana vitrectomy; VH Vitreous hemorrhage; PRP panretinal laser photocoagulation; IVK intravitreal kenalog; VMT vitreomacular traction; MH Macular hole;  NVD neovascularization of the disc; NVE neovascularization elsewhere; AREDS age related eye disease study; ARMD age related macular degeneration; POAG primary open angle glaucoma; EBMD epithelial/anterior basement membrane dystrophy; ACIOL anterior chamber intraocular lens; IOL intraocular lens; PCIOL posterior chamber intraocular lens; Phaco/IOL phacoemulsification with intraocular lens placement; PRK photorefractive keratectomy; LASIK laser assisted in situ keratomileusis; HTN hypertension; DM diabetes mellitus; COPD chronic obstructive pulmonary disease

## 2023-04-13 ENCOUNTER — Encounter (INDEPENDENT_AMBULATORY_CARE_PROVIDER_SITE_OTHER): Payer: Self-pay | Admitting: Ophthalmology

## 2023-04-13 ENCOUNTER — Ambulatory Visit (INDEPENDENT_AMBULATORY_CARE_PROVIDER_SITE_OTHER): Payer: Medicare HMO | Admitting: Ophthalmology

## 2023-04-13 DIAGNOSIS — E119 Type 2 diabetes mellitus without complications: Secondary | ICD-10-CM | POA: Diagnosis not present

## 2023-04-13 DIAGNOSIS — H35033 Hypertensive retinopathy, bilateral: Secondary | ICD-10-CM

## 2023-04-13 DIAGNOSIS — Z961 Presence of intraocular lens: Secondary | ICD-10-CM

## 2023-04-13 DIAGNOSIS — H442E3 Degenerative myopia with other maculopathy, bilateral eye: Secondary | ICD-10-CM

## 2023-04-13 DIAGNOSIS — Z794 Long term (current) use of insulin: Secondary | ICD-10-CM

## 2023-04-13 DIAGNOSIS — Z7984 Long term (current) use of oral hypoglycemic drugs: Secondary | ICD-10-CM

## 2023-04-13 DIAGNOSIS — I10 Essential (primary) hypertension: Secondary | ICD-10-CM

## 2023-04-13 DIAGNOSIS — H353134 Nonexudative age-related macular degeneration, bilateral, advanced atrophic with subfoveal involvement: Secondary | ICD-10-CM | POA: Diagnosis not present

## 2023-04-13 DIAGNOSIS — H3581 Retinal edema: Secondary | ICD-10-CM

## 2023-04-13 NOTE — Progress Notes (Incomplete)
[facility] - Clinic Note  04/13/2023   CHIEF COMPLAINT Patient presents for Retina Evaluation  HISTORY OF PRESENT ILLNESS: Patricia Lane is a 72 y.o. female who presents to the clinic today for:  HPI     Retina Evaluation   In both eyes.  Duration of 1 year.  Associated Symptoms Floaters and Fatigue.  Negative for Flashes, Distortion, Blind Spot, Pain, Redness, Photophobia, Glare, Trauma, Scalp Tenderness, Jaw Claudication, Shoulder/Hip pain, Fever and Weight Loss.  Treatments tried include injection.  I, the attending physician,  performed the HPI with the patient and updated documentation appropriately.        Comments   Retina eval per Dr.Van. Patient stop going to [facility] in [place]. Patient saw Dr.Pecen there. Patient having injections in both eyes. Patient had 5 injections in the right and 1 injection in the left. Last injection was around March or April this year. Dr. Zenaida Niece did a yag on od.  Patient states she has dry armd od wet armd os      Last edited by Rennis Chris, MD on 04/13/2023 11:58 PM.    Pt is here on the referral of Dr. Zenaida Niece for second opinion of ARMD OU, pt used to see Dr. Jenene Slicker at [facility] and was getting injections in both eyes, she said she got 5 injections and in her right eye and 1 injection in her left eye, pt states she went to [facility] one time and was told they can't do anything for her, pt was also seen at a low vision clinic in [place] and got glasses, which she says do help her, she has not had any injections since April/May, Dr. Zenaida Niece did a Yag on her right eye   Referring physician: Diona Foley, MD [address] [place],  [place] [address]  HISTORICAL INFORMATION:  Selected notes from the MEDICAL RECORD NUMBER Referred by Dr. Zenaida Niece for concern of ARMD OU LEE:  Ocular Hx- PMH-   CURRENT MEDICATIONS: No current outpatient medications on file. (Ophthalmic Drugs)   No current facility-administered medications for this visit.  (Ophthalmic Drugs)   Current Outpatient Medications (Other)  Medication Sig  . aspirin 81 MG tablet Take 81 mg by mouth daily.  Marland Kitchen atorvastatin (LIPITOR) 10 MG tablet   . baclofen (LIORESAL) 10 MG tablet   . Blood Glucose Calibration (TRUE METRIX LEVEL 1) Low SOLN   . Blood Glucose Monitoring Suppl (TRUE METRIX METER) w/Device KIT   . doxepin (SINEQUAN) 10 MG capsule   . gabapentin (NEURONTIN) 400 MG capsule   . gabapentin (NEURONTIN) 400 MG capsule Take 400 mg by mouth.  . hydrOXYzine (ATARAX/VISTARIL) 25 MG tablet   . losartan (COZAAR) 25 MG tablet Take 25 mg by mouth.  . meloxicam (MOBIC) 15 MG tablet   . metoprolol succinate (TOPROL-XL) 100 MG 24 hr tablet Take 100 mg by mouth daily. Take with or immediately following a meal.  . NOVOLIN N RELION 100 UNIT/ML injection   . omeprazole (PRILOSEC) 20 MG capsule Take 20 mg by mouth daily.  . Red Yeast Rice 600 MG CAPS Take 1,200 mg by mouth.  Marland Kitchen rOPINIRole (REQUIP) 2 MG tablet   . solifenacin (VESICARE) 5 MG tablet   . SYNJARDY XR 25-1000 MG TB24   . TRUE METRIX BLOOD GLUCOSE TEST test strip   . TRUEplus Lancets 33G MISC    No current facility-administered medications for this visit. (Other)   REVIEW OF SYSTEMS: ROS   Positive for: Endocrine,  Eyes, Psychiatric Negative for: Constitutional, Gastrointestinal, Neurological, Skin, Genitourinary, Musculoskeletal, HENT, Cardiovascular, Respiratory, Allergic/Imm, Heme/Lymph Last edited by Lana Fish, COT on 04/13/2023  1:01 PM.     ALLERGIES Allergies  Allergen Reactions  . Codeine Itching and Rash  . Demerol [Meperidine] Itching  . Penicillins Itching and Rash   PAST MEDICAL HISTORY Past Medical History:  Diagnosis Date  . Depression   . Diabetes mellitus without complication (HCC)   . Hypertension   . Psychiatric disorder   . Tremors of nervous system    Past Surgical History:  Procedure Laterality Date  . CATARACT EXTRACTION    . FOOT SURGERY    . GALLBLADDER SURGERY     . hysterectomy surgery    . NASAL SINUS SURGERY    . YAG LASER APPLICATION     FAMILY HISTORY Family History  Problem Relation Age of Onset  . Liver disease Father    SOCIAL HISTORY Social History   Tobacco Use  . Smoking status: Never  . Smokeless tobacco: Never       OPHTHALMIC EXAM:  Base Eye Exam     Visual Acuity (Snellen - Linear)       Right Left   Dist cc CF@2 ' 20/40 +1   Dist ph cc NI NI    Correction: Glasses         Tonometry (Tonopen, 1:23 PM)       Right Left   Pressure 13 13         Pupils       Dark Light Shape React APD   Right 4 3 Round Brisk None   Left 4 3 Round Brisk None         Visual Fields (Counting fingers)       Left Right   Restrictions Partial inner superior temporal, inferior temporal, superior nasal, inferior nasal deficiencies Partial inner superior temporal, inferior temporal, superior nasal, inferior nasal deficiencies         Extraocular Movement       Right Left    Full, Ortho Full, Ortho         Neuro/Psych     Oriented x3: Yes   Mood/Affect: Normal         Dilation     Both eyes: 1.0% Mydriacyl, 2.5% Phenylephrine @ 1:22 PM           Slit Lamp and Fundus Exam     Slit Lamp Exam       Right Left   Lids/Lashes Dermatochalasis - upper lid Dermatochalasis - upper lid   Conjunctiva/Sclera nasal and temporal pinguecula nasal and temporal pinguecula   Cornea 1+ inferior Punctate epithelial erosions, well healed cataract wound well healed cataract wound, mild tear film debris, trace PEE   Anterior Chamber deep and clear deep and clear   Iris Round and dilated Round and dilated   Lens PC IOL in good position with open PC PC IOL in good position with open PC   Anterior Vitreous mild syneresis mild syneresis         Fundus Exam       Right Left   Disc mild Pallor, Sharp rim, tilted, temporal PPA mild Pallor, Sharp rim, tilted, 360 PPA   C/D Ratio 0.3 0.3   Macula Flat, Blunted foveal  reflex, central GA with pigment clumping, No heme or edema Flat, Blunted foveal reflex, central GA with central sparring and pigment clumping, punctate IRH ST mac, no edema   Vessels attenuated, mild  tortuosity attenuated, mild tortuosity   Periphery Attached, No heme, 360 paving stone degeneration, focal peripheral cystoid degeneration temporal periphery Attached, 360 paving stone and pigmented CR scarring           Refraction     Manifest Refraction (Auto)       Sphere Cylinder Axis Dist VA   Right -0.50 +1.00 180 CF   Left [place] +1.00 015 20/40           IMAGING AND PROCEDURES  Imaging and Procedures for 04/13/2023  OCT, Retina - OU - Both Eyes       Right Eye Quality was good. Central Foveal Thickness: 247. Progression has no prior data. Findings include no IRF, no SRF, abnormal foveal contour, myopic contour, retinal drusen , outer retinal atrophy.   Left Eye Quality was good. Central Foveal Thickness: 264. Progression has no prior data. Findings include no IRF, no SRF, abnormal foveal contour, myopic contour, retinal drusen , epiretinal membrane, outer retinal atrophy.   Notes *Images captured and stored on drive  Diagnosis / Impression:  Non-exu ARMD with central GA OU No IRF/SRF OU No DME OU  Clinical management:  See below  Abbreviations: NFP - Normal foveal profile. CME - cystoid macular edema. PED - pigment epithelial detachment. IRF - intraretinal fluid. SRF - subretinal fluid. EZ - ellipsoid zone. ERM - epiretinal membrane. ORA - outer retinal atrophy. ORT - outer retinal tubulation. SRHM - subretinal hyper-reflective material. IRHM - intraretinal hyper-reflective material           ASSESSMENT/PLAN:   ICD-10-CM   1. 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  Diabetes mellitus type 2 without retinopathy (HCC)  E11.9     4. Current use of insulin (HCC)  Z79.4     5. Long term (current) use of oral hypoglycemic drugs  Z79.84     6. Essential hypertension  I10     7. Hypertensive retinopathy of both eyes  H35.033     8. Pseudophakia, both eyes  Z96.1      Age related macular degeneration, non-exudative, both eyes - ?hx of exudative disease OS  - hx of Syfovre injections with Dr. Jenene Slicker at [facility] -- last visit in April 2024  - OCT shows central atrophy YOU  - The incidence, anatomy, and pathology of dry AMD, risk of progression, and the AREDS and AREDS 2 study including smoking risks discussed with patient.  - Recommend amsler grid monitoring  - f/u 3 months, DFE, OCT  2. Myopic degeneration OU  3-5. Diabetes mellitus, type 2 without retinopathy - The incidence, risk factors for progression, natural history and treatment options for diabetic retinopathy  were discussed with patient.   - The need for close monitoring of blood glucose, blood pressure, and serum lipids, avoiding cigarette or any type of tobacco, and the need for long term follow up was also discussed with patient. - f/u in 1 year, sooner prn  6,7. Hypertensive retinopathy OU - discussed importance of tight BP control - monitor  8. Pseudophakia OU  - s/p CE/IOL  - IOL in good position, doing well  - monitor    Ophthalmic Meds Ordered this visit:  No orders of the defined types were placed in this encounter.    Return in about 3 months (  around 07/12/2023) for non ex ARMD OU, Dilated Exam, OCT.  There are no Patient Instructions on file for this visit.  Explained the diagnoses, plan, and follow up with the patient and they expressed understanding.  Patient expressed understanding of the importance of proper follow up care.   This document serves as a record of services personally performed by Karie Chimera, MD, PhD. It was created on their behalf by Charlette Caffey, COT an ophthalmic technician. The creation of this record is the provider's dictation and/or activities during the visit.    Electronically signed by:  Charlette Caffey, COT  04/13/23 11:59 PM  This document serves as a record of services personally performed by Karie Chimera, MD, PhD. It was created on their behalf by Glee Arvin. Manson Passey, OA an ophthalmic technician. The creation of this record is the provider's dictation and/or activities during the visit.    Electronically signed by: Glee Arvin. Manson Passey, OA 04/13/23 11:59 PM    Karie Chimera, M.D., Ph.D. Diseases & Surgery of the Retina and Vitreous [facility] 04/13/2023  Abbreviations: M myopia (nearsighted); A astigmatism; H hyperopia (farsighted); P presbyopia; Mrx spectacle prescription;  CTL contact lenses; OD right eye; OS left eye; OU both eyes  XT exotropia; ET esotropia; PEK punctate epithelial keratitis; PEE punctate epithelial erosions; DES dry eye syndrome; MGD meibomian gland dysfunction; ATs artificial tears; PFAT's preservative free artificial tears; NSC nuclear sclerotic cataract; PSC posterior subcapsular cataract; ERM epi-retinal membrane; PVD posterior vitreous detachment; RD retinal detachment; DM diabetes mellitus; DR diabetic retinopathy; NPDR non-proliferative diabetic retinopathy; PDR proliferative diabetic retinopathy; CSME clinically significant macular edema; DME diabetic macular edema; dbh dot blot hemorrhages; CWS cotton wool spot; POAG primary open angle glaucoma; C/D cup-to-disc ratio; HVF humphrey visual field; GVF goldmann visual field; OCT optical coherence tomography; IOP intraocular pressure; BRVO Branch retinal vein occlusion; CRVO central retinal vein occlusion; CRAO central retinal artery occlusion; BRAO branch retinal artery occlusion; RT retinal tear; SB scleral buckle; PPV pars plana vitrectomy; VH Vitreous hemorrhage; PRP panretinal laser photocoagulation; IVK  intravitreal kenalog; VMT vitreomacular traction; MH Macular hole;  NVD neovascularization of the disc; NVE neovascularization elsewhere; AREDS age related eye disease study; ARMD age related macular degeneration; POAG primary open angle glaucoma; EBMD epithelial/anterior basement membrane dystrophy; ACIOL anterior chamber intraocular lens; IOL intraocular lens; PCIOL posterior chamber intraocular lens; Phaco/IOL phacoemulsification with intraocular lens placement; PRK photorefractive keratectomy; LASIK laser assisted in situ keratomileusis; HTN hypertension; DM diabetes mellitus; COPD chronic obstructive pulmonary disease

## 2023-05-09 ENCOUNTER — Encounter (HOSPITAL_BASED_OUTPATIENT_CLINIC_OR_DEPARTMENT_OTHER): Payer: Self-pay | Admitting: Emergency Medicine

## 2023-05-09 ENCOUNTER — Ambulatory Visit (HOSPITAL_BASED_OUTPATIENT_CLINIC_OR_DEPARTMENT_OTHER)
Admission: EM | Admit: 2023-05-09 | Discharge: 2023-05-09 | Disposition: A | Discharge status: Home / Self Care | Payer: Self-pay

## 2023-05-09 DIAGNOSIS — R531 Weakness: Secondary | ICD-10-CM

## 2023-05-09 DIAGNOSIS — R112 Nausea with vomiting, unspecified: Secondary | ICD-10-CM

## 2023-05-09 DIAGNOSIS — R509 Fever, unspecified: Secondary | ICD-10-CM

## 2023-05-09 LAB — POC COVID19/FLU A&B COMBO
Covid Antigen, POC: NEGATIVE
Influenza A Antigen, POC: NEGATIVE
Influenza B Antigen, POC: NEGATIVE

## 2023-05-09 MED ORDER — ONDANSETRON HCL 4 MG PO TABS: 4.0000 mg | ORAL_TABLET | Freq: Three times a day (TID) | ORAL | 0 refills | Status: AC | PRN

## 2023-05-09 NOTE — ED Triage Notes (Signed)
 Pt c/o weakness, vomiting/ diarrhea since yesterday.

## 2023-05-09 NOTE — ED Provider Notes (Signed)
 [facility]    CSN: [id number] Arrival date & time: 05/09/23  1535      History   Chief Complaint Chief Complaint  Patient presents with   Emesis    HPI Patricia Lane is a 73 y.o. female.   Patient here with her husband.  She reports yesterday during the day she started with vomiting and then she had some diarrhea.  Now she is just weak.  She reports she was up intermittently all night vomiting.  Very little diarrhea, but she reports she has not kept anything down for 24 hours.  In talking with her her husband was hospitalized for 3 days approximately 8 to 10 days ago for norovirus or some GI infection.  They have a disagreement about whether or not he had actual norovirus.   Emesis Associated symptoms: abdominal pain, arthralgias, diarrhea and fever   Associated symptoms: no chills, no cough and no sore throat     Past Medical History:  Diagnosis Date   Depression    Diabetes mellitus without complication (HCC)    Hypertension    Psychiatric disorder    Tremors of nervous system     Patient Active Problem List   Diagnosis Date Noted   Diabetes mellitus (HCC) 07/01/2016   Hypertension 07/01/2016   Chronic back pain 07/01/2016    Past Surgical History:  Procedure Laterality Date   CATARACT EXTRACTION     FOOT SURGERY     GALLBLADDER SURGERY     hysterectomy surgery     NASAL SINUS SURGERY     YAG LASER APPLICATION      OB History   No obstetric history on file.      Home Medications    Prior to Admission medications   Medication Sig Start Date End Date Taking? Authorizing Provider  atorvastatin (LIPITOR) 10 MG tablet  01/27/19  Yes [provider]  gabapentin (NEURONTIN) 400 MG capsule  02/06/13  Yes [provider]  gabapentin (NEURONTIN) 400 MG capsule Take 400 mg by mouth.   Yes [provider]  losartan (COZAAR) 25 MG tablet Take 25 mg by mouth.   Yes [provider]  omeprazole (PRILOSEC) 20 MG capsule  Take 20 mg by mouth daily.   Yes [provider]  ondansetron  (ZOFRAN ) 4 MG tablet Take 1 tablet (4 mg total) by mouth every 8 (eight) hours as needed for nausea or vomiting. 05/09/23  Yes Ival Domino, FNP  predniSONE (DELTASONE) 10 MG tablet Take by mouth. 12/21/22  Yes [provider]  rOPINIRole (REQUIP) 2 MG tablet  12/06/19  Yes [provider]  aspirin 81 MG tablet Take 81 mg by mouth daily.    [provider]  baclofen (LIORESAL) 10 MG tablet  11/20/19   [provider]  Blood Glucose Calibration (TRUE METRIX LEVEL 1) Low SOLN  01/04/20   [provider]  Blood Glucose Monitoring Suppl (TRUE METRIX METER) w/Device KIT  01/08/20   [provider]  doxepin (SINEQUAN) 10 MG capsule  03/06/19   [provider]  hydrOXYzine (ATARAX/VISTARIL) 25 MG tablet  12/07/19   [provider]  meloxicam (MOBIC) 15 MG tablet  01/15/20   [provider]  metoprolol succinate (TOPROL-XL) 100 MG 24 hr tablet Take 100 mg by mouth daily. Take with or immediately following a meal.    [provider]  NOVOLIN N RELION 100 UNIT/ML injection  04/13/14   [provider]  Red Yeast Rice 600  MG CAPS Take 1,200 mg by mouth.    [provider]  solifenacin (VESICARE) 5 MG tablet  02/07/20   [provider]  SYNJARDY XR 25-1000 MG TB24  11/08/19   [provider]  TRUE METRIX BLOOD GLUCOSE TEST test strip  01/03/20   [provider]  TRUEplus Lancets 33G MISC  01/03/20   [provider]    Family History Family History  Problem Relation Age of Onset   Liver disease Father     Social History Social History   Tobacco Use   Smoking status: Never   Smokeless tobacco: Never     Allergies   Codeine, Demerol [meperidine], and Penicillins   Review of Systems Review of Systems  Constitutional:  Positive for fever. Negative for chills.  HENT:  Negative for congestion, ear  pain, rhinorrhea, sinus pressure, sinus pain and sore throat.   Eyes:  Negative for pain and visual disturbance.  Respiratory:  Negative for cough and shortness of breath.   Cardiovascular:  Negative for chest pain and palpitations.  Gastrointestinal:  Positive for abdominal pain, diarrhea, nausea and vomiting. Negative for constipation.  Genitourinary:  Negative for dysuria and hematuria.  Musculoskeletal:  Positive for arthralgias. Negative for back pain.  Skin:  Negative for color change and rash.  Neurological:  Positive for weakness. Negative for seizures and syncope.  All other systems reviewed and are negative.    Physical Exam Triage Vital Signs ED Triage Vitals  Encounter Vitals Group     BP 05/09/23 1545 123/76     Systolic BP Percentile --      Diastolic BP Percentile --      Pulse Rate 05/09/23 1545 100     Resp 05/09/23 1545 20     Temp 05/09/23 1545 98.9 F (37.2 C)     Temp Source 05/09/23 1545 Oral     SpO2 05/09/23 1545 97 %     Weight --      Height --      Head Circumference --      Peak Flow --      Pain Score 05/09/23 1543 0     Pain Loc --      Pain Education --      Exclude from Growth Chart --    No data found.  Updated Vital Signs BP 123/76 (BP Location: Right Arm)   Pulse 100   Temp 99.3 F (37.4 C)   Resp 20   SpO2 97%   Visual Acuity Right Eye Distance:   Left Eye Distance:   Bilateral Distance:    Right Eye Near:   Left Eye Near:    Bilateral Near:     Physical Exam Vitals and nursing note reviewed.  Constitutional:      General: She is not in acute distress.    Appearance: She is well-developed. She is obese. She is ill-appearing. She is not toxic-appearing.  HENT:     Head: Normocephalic and atraumatic.     Right Ear: Hearing, tympanic membrane, ear canal and external ear normal.     Left Ear: Hearing, tympanic membrane, ear canal and external ear normal.     Nose: Nose normal.     Mouth/Throat:     Lips: Pink.      Mouth: Mucous membranes are moist.     Pharynx: Uvula midline. No oropharyngeal exudate or posterior oropharyngeal erythema.     Tonsils: No tonsillar exudate.  Eyes:     Conjunctiva/sclera: Conjunctivae  normal.     Pupils: Pupils are equal, round, and reactive to light.  Cardiovascular:     Rate and Rhythm: Normal rate and regular rhythm.     Heart sounds: Normal heart sounds, S1 normal and S2 normal. No murmur heard. Pulmonary:     Effort: Pulmonary effort is normal. No respiratory distress.     Breath sounds: Normal breath sounds. No decreased breath sounds, wheezing, rhonchi or rales.  Abdominal:     Palpations: Abdomen is soft.     Tenderness: There is generalized abdominal tenderness (Mild to moderate generalized tenderness).  Musculoskeletal:        General: No swelling.     Cervical back: Neck supple.  Lymphadenopathy:     Head:     Right side of head: No submental, submandibular, tonsillar, preauricular or posterior auricular adenopathy.     Left side of head: No submental, submandibular, tonsillar, preauricular or posterior auricular adenopathy.     Cervical: No cervical adenopathy.     Right cervical: No superficial cervical adenopathy.    Left cervical: No superficial cervical adenopathy.  Skin:    General: Skin is warm and dry.     Capillary Refill: Capillary refill takes less than 2 seconds.     Findings: No rash.  Neurological:     Mental Status: She is alert and oriented to person, place, and time.  Psychiatric:        Mood and Affect: Mood normal.      UC Treatments / Results  Labs (all labs ordered are listed, but only abnormal results are displayed) Labs Reviewed  POC COVID19/FLU A&B COMBO - Normal    EKG   Radiology No results found.  Procedures Procedures (including critical care time)  Medications Ordered in UC Medications - No data to display  Initial Impression / Assessment and Plan / UC Course  I have reviewed the triage vital signs  and the nursing notes.  Pertinent labs & imaging results that were available during my care of the patient were reviewed by me and considered in my medical decision making (see chart for details).  Flu and COVID testing are negative.  Exam is most consistent with an intestinal virus.  Ondansetron , ODT, every 8 hours, as needed for nausea and vomiting.  Handouts given on fever, nausea and vomiting, dehydration.  Try to get plenty of fluids.  If not improving or condition worsens return here or go to an emergency room. Final Clinical Impressions(s) / UC Diagnoses   Final diagnoses:  Nausea and vomiting, unspecified vomiting type  Weakness  Fever, unspecified     Discharge Instructions      COVID and flu testing is negative.  Exam is most consistent with an intestinal virus.  No dairy products.  Clear liquids for 24 to 48 hours.  Provided ondansetron , melts on tongue, every 8 hours, as needed for N/V.   Provided handouts about nausea and vomiting and appropriate diet for nausea and vomiting.  Return here or go     ED Prescriptions     Medication Sig Dispense Auth. Provider   ondansetron  (ZOFRAN ) 4 MG tablet Take 1 tablet (4 mg total) by mouth every 8 (eight) hours as needed for nausea or vomiting. 20 tablet Risa Auman, FNP      PDMP not reviewed this encounter.   Ival Domino, FNP 05/09/23 [id number]

## 2023-05-09 NOTE — Discharge Instructions (Signed)
 COVID and flu testing is negative.  Exam is most consistent with an intestinal virus.  No dairy products.  Clear liquids for 24 to 48 hours.  Provided ondansetron , melts on tongue, every 8 hours, as needed for N/V.   Provided handouts about nausea and vomiting and appropriate diet for nausea and vomiting.  Return here or go

## 2023-07-13 ENCOUNTER — Encounter (INDEPENDENT_AMBULATORY_CARE_PROVIDER_SITE_OTHER): Payer: Self-pay | Admitting: Ophthalmology

## 2023-07-13 DIAGNOSIS — I10 Essential (primary) hypertension: Secondary | ICD-10-CM

## 2023-07-13 DIAGNOSIS — H442E3 Degenerative myopia with other maculopathy, bilateral eye: Secondary | ICD-10-CM

## 2023-07-13 DIAGNOSIS — E119 Type 2 diabetes mellitus without complications: Secondary | ICD-10-CM

## 2023-07-13 DIAGNOSIS — Z7984 Long term (current) use of oral hypoglycemic drugs: Secondary | ICD-10-CM

## 2023-07-13 DIAGNOSIS — H353134 Nonexudative age-related macular degeneration, bilateral, advanced atrophic with subfoveal involvement: Secondary | ICD-10-CM

## 2023-07-13 DIAGNOSIS — Z794 Long term (current) use of insulin: Secondary | ICD-10-CM

## 2023-07-13 DIAGNOSIS — H35033 Hypertensive retinopathy, bilateral: Secondary | ICD-10-CM

## 2023-07-13 DIAGNOSIS — Z961 Presence of intraocular lens: Secondary | ICD-10-CM

## 2023-07-23 DIAGNOSIS — I351 Nonrheumatic aortic (valve) insufficiency: Secondary | ICD-10-CM | POA: Diagnosis not present

## 2023-07-29 DIAGNOSIS — R9431 Abnormal electrocardiogram [ECG] [EKG]: Secondary | ICD-10-CM

## 2023-07-29 DIAGNOSIS — I351 Nonrheumatic aortic (valve) insufficiency: Secondary | ICD-10-CM

## 2023-07-29 DIAGNOSIS — R001 Bradycardia, unspecified: Secondary | ICD-10-CM

## 2023-07-29 DIAGNOSIS — I10 Essential (primary) hypertension: Secondary | ICD-10-CM

## 2023-08-23 NOTE — Progress Notes (Signed)
 [facility] - Clinic Note  08/30/2023   CHIEF COMPLAINT Patient presents for Retina Follow Up  HISTORY OF PRESENT ILLNESS: Patricia Lane is a 73 y.o. female who presents to the clinic today for:  HPI     Retina Follow Up   Patient presents with  CRVO/BRVO.  In both eyes.  This started 5 months ago.  Duration of 5 months.  Since onset it is gradually worsening.  I, the attending physician,  performed the HPI with the patient and updated documentation appropriately.        Comments   5 month retina follow up ARMD pt is reporting decreased vision she denies any flashes or floaters pt states she sees things that are not there like people walking around. She is reporting unstable glycemic control and was recently in the hospital her last reading was 110 this am A1C 9.1      Last edited by Ronelle Coffee, MD on 08/30/2023 10:14 PM.    Pt states her vision is getting worse, she sees things that are not there, like people crossing the street  Referring physician: Orin Birk, MD [address] [place],  [place] [address]  HISTORICAL INFORMATION:  Selected notes from the MEDICAL RECORD NUMBER Referred by Dr. Carloyn Chi for concern of ARMD OU LEE:  Ocular Hx- PMH-   CURRENT MEDICATIONS: No current outpatient medications on file. (Ophthalmic Drugs)   No current facility-administered medications for this visit. (Ophthalmic Drugs)   Current Outpatient Medications (Other)  Medication Sig   aspirin 81 MG tablet Take 81 mg by mouth daily.   atorvastatin (LIPITOR) 10 MG tablet    baclofen (LIORESAL) 10 MG tablet    Blood Glucose Calibration (TRUE METRIX LEVEL 1) Low SOLN    Blood Glucose Monitoring Suppl (TRUE METRIX METER) w/Device KIT    doxepin (SINEQUAN) 10 MG capsule    gabapentin (NEURONTIN) 400 MG capsule    gabapentin (NEURONTIN) 400 MG capsule Take 400 mg by mouth.   hydrOXYzine (ATARAX/VISTARIL) 25 MG tablet    losartan (COZAAR) 25 MG tablet Take 25  mg by mouth.   meloxicam (MOBIC) 15 MG tablet    metoprolol succinate (TOPROL-XL) 100 MG 24 hr tablet Take 100 mg by mouth daily. Take with or immediately following a meal.   NOVOLIN N RELION 100 UNIT/ML injection    omeprazole (PRILOSEC) 20 MG capsule Take 20 mg by mouth daily.   ondansetron  (ZOFRAN ) 4 MG tablet Take 1 tablet (4 mg total) by mouth every 8 (eight) hours as needed for nausea or vomiting.   predniSONE (DELTASONE) 10 MG tablet Take by mouth.   Red Yeast Rice 600 MG CAPS Take 1,200 mg by mouth.   rOPINIRole (REQUIP) 2 MG tablet    solifenacin (VESICARE) 5 MG tablet    SYNJARDY XR 25-1000 MG TB24    TRUE METRIX BLOOD GLUCOSE TEST test strip    TRUEplus Lancets 33G MISC    No current facility-administered medications for this visit. (Other)   REVIEW OF SYSTEMS: ROS   Positive for: Endocrine, Eyes, Psychiatric Negative for: Constitutional, Gastrointestinal, Neurological, Skin, Genitourinary, Musculoskeletal, HENT, Cardiovascular, Respiratory, Allergic/Imm, Heme/Lymph Last edited by Alise Appl, COT on 08/30/2023  1:57 PM.     ALLERGIES Allergies  Allergen Reactions   Codeine Itching and Rash   Demerol [Meperidine] Itching   Penicillins Itching and Rash   PAST MEDICAL HISTORY Past Medical History:  Diagnosis Date   Depression  Diabetes mellitus without complication (HCC)    Hypertension    Psychiatric disorder    Tremors of nervous system    Past Surgical History:  Procedure Laterality Date   CATARACT EXTRACTION     FOOT SURGERY     GALLBLADDER SURGERY     hysterectomy surgery     NASAL SINUS SURGERY     YAG LASER APPLICATION     FAMILY HISTORY Family History  Problem Relation Age of Onset   Liver disease Father    SOCIAL HISTORY Social History   Tobacco Use   Smoking status: Never   Smokeless tobacco: Never       OPHTHALMIC EXAM:  Base Eye Exam     Visual Acuity (Snellen - Linear)       Right Left   Dist cc CF at 3' 20/50 -2    Dist ph cc NI NI    Correction: Glasses         Tonometry (Tonopen, 2:01 PM)       Right Left   Pressure 15 14         Pupils       Pupils Dark Light Shape React APD   Right PERRL 4 3 Round Brisk None   Left PERRL 4 3 Round Brisk None         Visual Fields       Left Right    Full Full         Extraocular Movement       Right Left    Full, Ortho Full, Ortho         Neuro/Psych     Oriented x3: Yes   Mood/Affect: Normal         Dilation     Both eyes: 2.5% Phenylephrine @ 2:01 PM           Slit Lamp and Fundus Exam     Slit Lamp Exam       Right Left   Lids/Lashes Dermatochalasis - upper lid Dermatochalasis - upper lid   Conjunctiva/Sclera nasal and temporal pinguecula nasal and temporal pinguecula   Cornea 1+ inferior Punctate epithelial erosions, well healed cataract wound well healed cataract wound, mild tear film debris, trace PEE   Anterior Chamber deep and clear deep and clear   Iris Round and dilated Round and dilated   Lens PC IOL in good position with open PC PC IOL in good position with open PC   Anterior Vitreous mild syneresis, Posterior vitreous detachment, Vitreous syneresis mild syneresis, Posterior vitreous detachment, vitreous condensations         Fundus Exam       Right Left   Disc mild Pallor, Sharp rim, tilted, temporal PPA mild Pallor, Sharp rim, tilted, 360 PPA   C/D Ratio 0.3 0.3   Macula Flat, Blunted foveal reflex, central GA with pigment clumping, No heme or edema Flat, Blunted foveal reflex, central GA with central sparring and pigment clumping, punctate IRH ST mac -- improved, no edema   Vessels attenuated, mild tortuosity attenuated, Tortuous   Periphery Attached, No heme, 360 paving stone degeneration, focal peripheral cystoid degeneration temporal periphery Attached, 360 paving stone and pigmented CR scarring           IMAGING AND PROCEDURES  Imaging and Procedures for 08/30/2023  OCT, Retina - OU -  Both Eyes       Right Eye Quality was good. Central Foveal Thickness: 227. Progression has been stable. Findings include no IRF,  no SRF, abnormal foveal contour, myopic contour, retinal drusen , outer retinal atrophy (Diffuse ORA).   Left Eye Quality was good. Central Foveal Thickness: 253. Progression has been stable. Findings include no IRF, no SRF, abnormal foveal contour, myopic contour, retinal drusen , epiretinal membrane, outer retinal atrophy (Central atrophy).   Notes *Images captured and stored on drive  Diagnosis / Impression:  Non-exu ARMD with central GA OU No IRF/SRF OU No DME OU  Clinical management:  See below  Abbreviations: NFP - Normal foveal profile. CME - cystoid macular edema. PED - pigment epithelial detachment. IRF - intraretinal fluid. SRF - subretinal fluid. EZ - ellipsoid zone. ERM - epiretinal membrane. ORA - outer retinal atrophy. ORT - outer retinal tubulation. SRHM - subretinal hyper-reflective material. IRHM - intraretinal hyper-reflective material           ASSESSMENT/PLAN:   ICD-10-CM   1. 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 Diabetes mellitus type 2 without retinopathy (HCC)  E11.9     4. Current use of insulin (HCC)  Z79.4     5. Long term (current) use of oral hypoglycemic drugs  Z79.84     6. Essential hypertension  I10     7. Hypertensive retinopathy of both eyes  H35.033     8. Pseudophakia, both eyes  Z96.1      Age related macular degeneration, non-exudative, both eyes - ?hx of exudative disease OS  - hx of Syfovre injections with Dr. Pecen at [facility] -- last visit in April 2024  - pt has also been seen by Dr. Serge Dancer at [facility].  - OCT shows central atrophy OU -- OD with fovea spared  - BCVA OD CF @3  -- stable', OS  decreased to 20/50 from 20/40  - The incidence, anatomy, and pathology of dry AMD, risk of progression, and the AREDS and AREDS 2 study including smoking risks discussed with patient.  - Cont AREDS 2 supplementation and amsler grid monitoring  - no injections recommended at this time  - f/u 6 months, sooner prn DFE, OCT   2. Myopic degeneration OU  - OCT shows myopic contour OU  - may be contributing to central atrophy   3-5. Diabetes mellitus, type 2 without retinopathy  - last A1c: 9.1 on 03.30.25 - The incidence, risk factors for progression, natural history and treatment options for diabetic retinopathy  were discussed with patient.   - The need for close monitoring of blood glucose, blood pressure, and serum lipids, avoiding cigarette or any type of tobacco, and the need for long term follow up was also discussed with patient. - f/u in 1 year, sooner prn  6,7. Hypertensive retinopathy OU - discussed importance of tight BP control - monitor  8. Pseudophakia OU  - s/p CE/IOL OU  - IOLs in good position, doing well  - monitor  Ophthalmic Meds Ordered this visit:  No orders of the defined types were placed in this encounter.    Return in about 6 months (around 03/01/2024) for f/u non-exu ARMD OU, DFE, OCT.  There are no Patient Instructions on file for this visit.  Explained the diagnoses, plan, and follow up with the patient and they expressed understanding.  Patient expressed understanding of the importance of proper follow up care.  This document serves as a record of services personally performed by Jeanice Millard, MD, PhD. It was created on their behalf by Morley Arabia. Bevin Bucks, OA an ophthalmic technician. The creation of this record is the provider's dictation and/or activities during the visit.    Electronically signed by: Morley Arabia. Bevin Bucks, OA 08/30/23 10:14 PM   Jeanice Millard, M.D., Ph.D. Diseases & Surgery of the Retina and Vitreous [facility] 08/30/2023  I have reviewed the above documentation for accuracy and completeness, and I agree with the above. Jeanice Millard, M.D., Ph.D. 08/30/23 10:16 PM    Abbreviations: M myopia (nearsighted); A astigmatism; H hyperopia (farsighted); P presbyopia; Mrx spectacle prescription;  CTL contact lenses; OD right eye; OS left eye; OU both eyes  XT exotropia; ET esotropia; PEK punctate epithelial keratitis; PEE punctate epithelial erosions; DES dry eye syndrome; MGD meibomian gland dysfunction; ATs artificial tears; PFAT's preservative free artificial tears; NSC nuclear sclerotic cataract; PSC posterior subcapsular cataract; ERM epi-retinal membrane; PVD posterior vitreous detachment; RD retinal detachment; DM diabetes mellitus; DR diabetic retinopathy; NPDR non-proliferative diabetic retinopathy; PDR proliferative diabetic retinopathy; CSME clinically significant macular edema; DME diabetic macular edema; dbh dot blot hemorrhages; CWS cotton wool spot; POAG primary open angle glaucoma; C/D cup-to-disc ratio; HVF humphrey visual field; GVF goldmann visual field; OCT optical coherence tomography; IOP intraocular pressure; BRVO Branch retinal vein occlusion; CRVO central retinal vein occlusion; CRAO central retinal artery occlusion; BRAO branch retinal artery occlusion; RT retinal tear; SB scleral buckle; PPV pars plana vitrectomy; VH Vitreous hemorrhage; PRP panretinal laser photocoagulation; IVK intravitreal kenalog ; VMT vitreomacular traction; MH Macular hole;  NVD neovascularization of the disc; NVE neovascularization elsewhere; AREDS age related eye disease study; ARMD age related macular degeneration; POAG primary open angle glaucoma; EBMD epithelial/anterior basement membrane dystrophy; ACIOL anterior chamber intraocular lens; IOL intraocular lens; PCIOL posterior chamber intraocular lens; Phaco/IOL phacoemulsification with intraocular lens placement; PRK photorefractive keratectomy; LASIK laser  assisted in situ keratomileusis; HTN hypertension; DM diabetes mellitus; COPD chronic obstructive pulmonary disease

## 2023-08-30 ENCOUNTER — Ambulatory Visit (INDEPENDENT_AMBULATORY_CARE_PROVIDER_SITE_OTHER): Payer: Self-pay | Admitting: Ophthalmology

## 2023-08-30 ENCOUNTER — Encounter (INDEPENDENT_AMBULATORY_CARE_PROVIDER_SITE_OTHER): Payer: Self-pay | Admitting: Ophthalmology

## 2023-08-30 DIAGNOSIS — Z7984 Long term (current) use of oral hypoglycemic drugs: Secondary | ICD-10-CM

## 2023-08-30 DIAGNOSIS — Z961 Presence of intraocular lens: Secondary | ICD-10-CM

## 2023-08-30 DIAGNOSIS — H35033 Hypertensive retinopathy, bilateral: Secondary | ICD-10-CM | POA: Diagnosis not present

## 2023-08-30 DIAGNOSIS — E119 Type 2 diabetes mellitus without complications: Secondary | ICD-10-CM | POA: Diagnosis not present

## 2023-08-30 DIAGNOSIS — H353134 Nonexudative age-related macular degeneration, bilateral, advanced atrophic with subfoveal involvement: Secondary | ICD-10-CM

## 2023-08-30 DIAGNOSIS — I10 Essential (primary) hypertension: Secondary | ICD-10-CM

## 2023-08-30 DIAGNOSIS — H442E3 Degenerative myopia with other maculopathy, bilateral eye: Secondary | ICD-10-CM

## 2023-08-30 DIAGNOSIS — Z794 Long term (current) use of insulin: Secondary | ICD-10-CM | POA: Diagnosis not present

## 2024-02-24 NOTE — Progress Notes (Shared)
 0 [facility] - Clinic Note  03/01/2024   CHIEF COMPLAINT Patient presents for No chief complaint on file.  HISTORY OF PRESENT ILLNESS: Patricia Lane is a 73 y.o. female who presents to the clinic today for:   Pt states   Referring physician: Jama Chow, MD [address] [place],  [place] [address]  HISTORICAL INFORMATION:  Selected notes from the MEDICAL RECORD NUMBER Referred by Dr. Fleeta for concern of ARMD OU LEE:  Ocular Hx- PMH-   CURRENT MEDICATIONS: No current outpatient medications on file. (Ophthalmic Drugs)   No current facility-administered medications for this visit. (Ophthalmic Drugs)   Current Outpatient Medications (Other)  Medication Sig   aspirin 81 MG tablet Take 81 mg by mouth daily.   atorvastatin (LIPITOR) 10 MG tablet    baclofen (LIORESAL) 10 MG tablet    Blood Glucose Calibration (TRUE METRIX LEVEL 1) Low SOLN    Blood Glucose Monitoring Suppl (TRUE METRIX METER) w/Device KIT    doxepin (SINEQUAN) 10 MG capsule    gabapentin (NEURONTIN) 400 MG capsule    gabapentin (NEURONTIN) 400 MG capsule Take 400 mg by mouth.   hydrOXYzine (ATARAX/VISTARIL) 25 MG tablet    losartan (COZAAR) 25 MG tablet Take 25 mg by mouth.   meloxicam (MOBIC) 15 MG tablet    metoprolol succinate (TOPROL-XL) 100 MG 24 hr tablet Take 100 mg by mouth daily. Take with or immediately following a meal.   NOVOLIN N RELION 100 UNIT/ML injection    omeprazole (PRILOSEC) 20 MG capsule Take 20 mg by mouth daily.   ondansetron  (ZOFRAN ) 4 MG tablet Take 1 tablet (4 mg total) by mouth every 8 (eight) hours as needed for nausea or vomiting.   predniSONE (DELTASONE) 10 MG tablet Take by mouth.   Red Yeast Rice 600 MG CAPS Take 1,200 mg by mouth.   rOPINIRole (REQUIP) 2 MG tablet    solifenacin (VESICARE) 5 MG tablet    SYNJARDY XR 25-1000 MG TB24    TRUE METRIX BLOOD GLUCOSE TEST test strip    TRUEplus Lancets 33G MISC    No current facility-administered  medications for this visit. (Other)   REVIEW OF SYSTEMS:   ALLERGIES Allergies  Allergen Reactions   Codeine Itching and Rash   Demerol [Meperidine] Itching   Penicillins Itching and Rash   PAST MEDICAL HISTORY Past Medical History:  Diagnosis Date   Depression    Diabetes mellitus without complication (HCC)    Hypertension    Psychiatric disorder    Tremors of nervous system    Past Surgical History:  Procedure Laterality Date   CATARACT EXTRACTION     FOOT SURGERY     GALLBLADDER SURGERY     hysterectomy surgery     NASAL SINUS SURGERY     YAG LASER APPLICATION     FAMILY HISTORY Family History  Problem Relation Age of Onset   Liver disease Father    SOCIAL HISTORY Social History   Tobacco Use   Smoking status: Never   Smokeless tobacco: Never       OPHTHALMIC EXAM:  Not recorded    IMAGING AND PROCEDURES  Imaging and Procedures for 03/01/2024         ASSESSMENT/PLAN: No diagnosis found.  Age related macular degeneration, non-exudative, both eyes - ?hx of exudative disease OS  - hx of Syfovre injections with Dr. Pecen at [facility] -- last visit in April 2024  - pt has also  been seen by Dr. Mat at [facility].  - OCT shows central atrophy OU -- OD with fovea spared  - BCVA OD CF @3  -- stable', OS decreased to 20/50 from 20/40  - The incidence, anatomy, and pathology of dry AMD, risk of progression, and the AREDS and AREDS 2 study including smoking risks discussed with patient.  - Cont AREDS 2 supplementation and amsler grid monitoring  - no injections recommended at this time  - f/u 6 months, sooner prn DFE, OCT   2. Myopic degeneration OU  - OCT shows myopic contour OU  - may be contributing to central atrophy   3-5. Diabetes mellitus, type 2 without retinopathy  - last A1c: 9.1 on 03.30.25 - The incidence, risk factors for progression, natural history and treatment options for diabetic retinopathy  were discussed with patient.    - The need for close monitoring of blood glucose, blood pressure, and serum lipids, avoiding cigarette or any type of tobacco, and the need for long term follow up was also discussed with patient. - f/u in 1 year, sooner prn  6,7. Hypertensive retinopathy OU - discussed importance of tight BP control - monitor  8. Pseudophakia OU  - s/p CE/IOL OU  - IOLs in good position, doing well  - monitor  Ophthalmic Meds Ordered this visit:  No orders of the defined types were placed in this encounter.    No follow-ups on file.  There are no Patient Instructions on file for this visit.  Explained the diagnoses, plan, and follow up with the patient and they expressed understanding.  Patient expressed understanding of the importance of proper follow up care.  This document serves as a record of services personally performed by Redell JUDITHANN Hans, MD, PhD. It was created on their behalf by Almetta Pesa, an ophthalmic technician. The creation of this record is the provider's dictation and/or activities during the visit.    Electronically signed by: Almetta Pesa, OA, 02/24/24  7:55 AM    Redell JUDITHANN Hans, M.D., Ph.D. Diseases & Surgery of the Retina and Vitreous [facility] 03/01/2024    Abbreviations: M myopia (nearsighted); A astigmatism; H hyperopia (farsighted); P presbyopia; Mrx spectacle prescription;  CTL contact lenses; OD right eye; OS left eye; OU both eyes  XT exotropia; ET esotropia; PEK punctate epithelial keratitis; PEE punctate epithelial erosions; DES dry eye syndrome; MGD meibomian gland dysfunction; ATs artificial tears; PFAT's preservative free artificial tears; NSC nuclear sclerotic cataract; PSC posterior subcapsular cataract; ERM epi-retinal membrane; PVD posterior vitreous detachment; RD retinal detachment; DM diabetes mellitus; DR diabetic retinopathy; NPDR non-proliferative diabetic retinopathy; PDR proliferative diabetic retinopathy; CSME  clinically significant macular edema; DME diabetic macular edema; dbh dot blot hemorrhages; CWS cotton wool spot; POAG primary open angle glaucoma; C/D cup-to-disc ratio; HVF humphrey visual field; GVF goldmann visual field; OCT optical coherence tomography; IOP intraocular pressure; BRVO Branch retinal vein occlusion; CRVO central retinal vein occlusion; CRAO central retinal artery occlusion; BRAO branch retinal artery occlusion; RT retinal tear; SB scleral buckle; PPV pars plana vitrectomy; VH Vitreous hemorrhage; PRP panretinal laser photocoagulation; IVK intravitreal kenalog ; VMT vitreomacular traction; MH Macular hole;  NVD neovascularization of the disc; NVE neovascularization elsewhere; AREDS age related eye disease study; ARMD age related macular degeneration; POAG primary open angle glaucoma; EBMD epithelial/anterior basement membrane dystrophy; ACIOL anterior chamber intraocular lens; IOL intraocular lens; PCIOL posterior chamber intraocular lens; Phaco/IOL phacoemulsification with intraocular lens placement; PRK photorefractive keratectomy; LASIK laser assisted in situ  keratomileusis; HTN hypertension; DM diabetes mellitus; COPD chronic obstructive pulmonary disease

## 2024-03-01 ENCOUNTER — Encounter (INDEPENDENT_AMBULATORY_CARE_PROVIDER_SITE_OTHER): Admitting: Ophthalmology

## 2024-03-01 ENCOUNTER — Encounter (INDEPENDENT_AMBULATORY_CARE_PROVIDER_SITE_OTHER): Payer: Self-pay

## 2024-03-01 DIAGNOSIS — H442E3 Degenerative myopia with other maculopathy, bilateral eye: Secondary | ICD-10-CM

## 2024-03-01 DIAGNOSIS — Z7984 Long term (current) use of oral hypoglycemic drugs: Secondary | ICD-10-CM

## 2024-03-01 DIAGNOSIS — Z961 Presence of intraocular lens: Secondary | ICD-10-CM

## 2024-03-01 DIAGNOSIS — H353134 Nonexudative age-related macular degeneration, bilateral, advanced atrophic with subfoveal involvement: Secondary | ICD-10-CM

## 2024-03-01 DIAGNOSIS — Z794 Long term (current) use of insulin: Secondary | ICD-10-CM

## 2024-03-01 DIAGNOSIS — H35033 Hypertensive retinopathy, bilateral: Secondary | ICD-10-CM

## 2024-03-01 DIAGNOSIS — I10 Essential (primary) hypertension: Secondary | ICD-10-CM

## 2024-03-01 DIAGNOSIS — E119 Type 2 diabetes mellitus without complications: Secondary | ICD-10-CM
